# Patient Record
Sex: Male | Born: 1960
Health system: Southern US, Community
[De-identification: ages and names within clinical notes are randomized; demographics above are authoritative.]

## PROBLEM LIST (undated history)

## (undated) DIAGNOSIS — D649 Anemia, unspecified: Secondary | ICD-10-CM

## (undated) DIAGNOSIS — G43909 Migraine, unspecified, not intractable, without status migrainosus: Secondary | ICD-10-CM

## (undated) DIAGNOSIS — F419 Anxiety disorder, unspecified: Secondary | ICD-10-CM

## (undated) DIAGNOSIS — R9430 Abnormal result of cardiovascular function study, unspecified: Secondary | ICD-10-CM

## (undated) DIAGNOSIS — I351 Nonrheumatic aortic (valve) insufficiency: Secondary | ICD-10-CM

## (undated) DIAGNOSIS — Z79899 Other long term (current) drug therapy: Secondary | ICD-10-CM

## (undated) DIAGNOSIS — IMO0002 Reserved for concepts with insufficient information to code with codable children: Secondary | ICD-10-CM

## (undated) DIAGNOSIS — Z952 Presence of prosthetic heart valve: Secondary | ICD-10-CM

## (undated) DIAGNOSIS — I341 Nonrheumatic mitral (valve) prolapse: Secondary | ICD-10-CM

## (undated) DIAGNOSIS — K922 Gastrointestinal hemorrhage, unspecified: Secondary | ICD-10-CM

## (undated) DIAGNOSIS — I77810 Thoracic aortic ectasia: Secondary | ICD-10-CM

## (undated) HISTORY — DX: Presence of prosthetic heart valve: Z95.2

## (undated) HISTORY — DX: Gastrointestinal hemorrhage, unspecified: K92.2

## (undated) HISTORY — DX: Nonrheumatic mitral (valve) prolapse: I34.1

## (undated) HISTORY — DX: Migraine, unspecified, not intractable, without status migrainosus: G43.909

## (undated) HISTORY — DX: Abnormal result of cardiovascular function study, unspecified: R94.30

## (undated) HISTORY — DX: Other long term (current) drug therapy: Z79.899

## (undated) HISTORY — DX: Anemia, unspecified: D64.9

## (undated) HISTORY — DX: Reserved for concepts with insufficient information to code with codable children: IMO0002

## (undated) HISTORY — DX: Nonrheumatic aortic (valve) insufficiency: I35.1

## (undated) HISTORY — DX: Anxiety disorder, unspecified: F41.9

## (undated) HISTORY — DX: Thoracic aortic ectasia: I77.810

---

## 2000-08-25 ENCOUNTER — Emergency Department (HOSPITAL_COMMUNITY): Admission: EM | Admit: 2000-08-25 | Discharge: 2000-08-25 | Payer: Self-pay

## 2000-10-11 ENCOUNTER — Encounter: Payer: Self-pay | Admitting: Emergency Medicine

## 2000-10-11 ENCOUNTER — Inpatient Hospital Stay (HOSPITAL_COMMUNITY): Admission: EM | Admit: 2000-10-11 | Discharge: 2000-10-12 | Payer: Self-pay | Admitting: Emergency Medicine

## 2000-10-24 ENCOUNTER — Ambulatory Visit (HOSPITAL_COMMUNITY): Admission: RE | Admit: 2000-10-24 | Discharge: 2000-10-24 | Payer: Self-pay | Admitting: Cardiology

## 2002-02-13 ENCOUNTER — Ambulatory Visit (HOSPITAL_COMMUNITY): Admission: RE | Admit: 2002-02-13 | Discharge: 2002-02-13 | Payer: Self-pay | Admitting: Cardiology

## 2003-08-26 ENCOUNTER — Ambulatory Visit (HOSPITAL_COMMUNITY): Admission: RE | Admit: 2003-08-26 | Discharge: 2003-08-26 | Payer: Self-pay | Admitting: Cardiology

## 2004-11-08 ENCOUNTER — Ambulatory Visit: Payer: Self-pay | Admitting: Cardiology

## 2004-11-08 ENCOUNTER — Ambulatory Visit (HOSPITAL_COMMUNITY): Admission: RE | Admit: 2004-11-08 | Discharge: 2004-11-08 | Payer: Self-pay | Admitting: Cardiology

## 2004-11-09 ENCOUNTER — Ambulatory Visit (HOSPITAL_COMMUNITY): Admission: RE | Admit: 2004-11-09 | Discharge: 2004-11-09 | Payer: Self-pay | Admitting: Cardiology

## 2004-11-14 ENCOUNTER — Inpatient Hospital Stay (HOSPITAL_BASED_OUTPATIENT_CLINIC_OR_DEPARTMENT_OTHER): Admission: RE | Admit: 2004-11-14 | Discharge: 2004-11-14 | Payer: Self-pay | Admitting: Cardiology

## 2004-11-14 ENCOUNTER — Ambulatory Visit: Payer: Self-pay | Admitting: Cardiology

## 2004-11-17 ENCOUNTER — Ambulatory Visit (HOSPITAL_COMMUNITY): Admission: RE | Admit: 2004-11-17 | Discharge: 2004-11-17 | Payer: Self-pay | Admitting: Cardiology

## 2004-11-25 ENCOUNTER — Ambulatory Visit: Payer: Self-pay | Admitting: Cardiology

## 2004-12-05 ENCOUNTER — Inpatient Hospital Stay (HOSPITAL_COMMUNITY): Admission: RE | Admit: 2004-12-05 | Discharge: 2004-12-10 | Payer: Self-pay | Admitting: Cardiothoracic Surgery

## 2004-12-05 HISTORY — PX: AORTIC VALVE REPLACEMENT: SHX41

## 2004-12-12 ENCOUNTER — Ambulatory Visit: Payer: Self-pay | Admitting: Cardiology

## 2004-12-13 ENCOUNTER — Emergency Department (HOSPITAL_COMMUNITY): Admission: EM | Admit: 2004-12-13 | Discharge: 2004-12-13 | Payer: Self-pay | Admitting: Emergency Medicine

## 2004-12-19 ENCOUNTER — Ambulatory Visit: Payer: Self-pay | Admitting: Cardiology

## 2004-12-23 ENCOUNTER — Ambulatory Visit: Payer: Self-pay | Admitting: Cardiology

## 2004-12-23 ENCOUNTER — Ambulatory Visit: Payer: Self-pay | Admitting: Internal Medicine

## 2005-01-02 ENCOUNTER — Ambulatory Visit: Payer: Self-pay | Admitting: *Deleted

## 2005-01-17 ENCOUNTER — Ambulatory Visit: Payer: Self-pay | Admitting: Internal Medicine

## 2005-02-06 ENCOUNTER — Ambulatory Visit: Payer: Self-pay | Admitting: Cardiology

## 2005-02-27 ENCOUNTER — Ambulatory Visit: Payer: Self-pay | Admitting: Cardiology

## 2005-03-27 ENCOUNTER — Ambulatory Visit: Payer: Self-pay | Admitting: Cardiology

## 2005-04-17 ENCOUNTER — Ambulatory Visit: Payer: Self-pay | Admitting: Cardiology

## 2005-05-16 ENCOUNTER — Ambulatory Visit: Payer: Self-pay | Admitting: Cardiovascular Disease

## 2005-06-15 ENCOUNTER — Ambulatory Visit: Payer: Self-pay | Admitting: Cardiology

## 2005-07-06 ENCOUNTER — Ambulatory Visit: Payer: Self-pay | Admitting: Cardiology

## 2005-08-07 ENCOUNTER — Ambulatory Visit: Payer: Self-pay | Admitting: Cardiology

## 2005-09-04 ENCOUNTER — Ambulatory Visit: Payer: Self-pay | Admitting: Cardiology

## 2005-10-02 ENCOUNTER — Ambulatory Visit: Payer: Self-pay | Admitting: Cardiology

## 2005-10-30 ENCOUNTER — Ambulatory Visit: Payer: Self-pay | Admitting: Internal Medicine

## 2005-11-27 ENCOUNTER — Ambulatory Visit: Payer: Self-pay | Admitting: Cardiology

## 2005-12-25 ENCOUNTER — Ambulatory Visit: Payer: Self-pay | Admitting: Cardiology

## 2006-04-02 ENCOUNTER — Ambulatory Visit (HOSPITAL_COMMUNITY): Admission: RE | Admit: 2006-04-02 | Discharge: 2006-04-02 | Payer: Self-pay | Admitting: Cardiology

## 2006-04-02 ENCOUNTER — Ambulatory Visit: Payer: Self-pay | Admitting: Cardiology

## 2006-11-26 ENCOUNTER — Ambulatory Visit: Payer: Self-pay | Admitting: Internal Medicine

## 2006-11-26 ENCOUNTER — Inpatient Hospital Stay (HOSPITAL_COMMUNITY): Admission: EM | Admit: 2006-11-26 | Discharge: 2006-11-28 | Payer: Self-pay | Admitting: Emergency Medicine

## 2006-12-03 ENCOUNTER — Ambulatory Visit: Payer: Self-pay | Admitting: Gastroenterology

## 2006-12-03 ENCOUNTER — Ambulatory Visit (HOSPITAL_COMMUNITY): Admission: RE | Admit: 2006-12-03 | Discharge: 2006-12-03 | Payer: Self-pay | Admitting: Gastroenterology

## 2007-01-03 ENCOUNTER — Ambulatory Visit: Payer: Self-pay | Admitting: Internal Medicine

## 2010-11-30 ENCOUNTER — Encounter: Payer: Self-pay | Admitting: Cardiology

## 2010-12-13 ENCOUNTER — Encounter: Payer: Self-pay | Admitting: Cardiology

## 2010-12-13 DIAGNOSIS — Z79899 Other long term (current) drug therapy: Secondary | ICD-10-CM | POA: Insufficient documentation

## 2010-12-13 DIAGNOSIS — Z952 Presence of prosthetic heart valve: Secondary | ICD-10-CM | POA: Insufficient documentation

## 2010-12-13 DIAGNOSIS — I351 Nonrheumatic aortic (valve) insufficiency: Secondary | ICD-10-CM | POA: Insufficient documentation

## 2010-12-13 DIAGNOSIS — K922 Gastrointestinal hemorrhage, unspecified: Secondary | ICD-10-CM | POA: Insufficient documentation

## 2010-12-14 ENCOUNTER — Ambulatory Visit (INDEPENDENT_AMBULATORY_CARE_PROVIDER_SITE_OTHER): Payer: 59 | Admitting: Cardiology

## 2010-12-14 ENCOUNTER — Encounter: Payer: Self-pay | Admitting: Cardiology

## 2010-12-14 DIAGNOSIS — Z5189 Encounter for other specified aftercare: Secondary | ICD-10-CM

## 2010-12-14 DIAGNOSIS — I359 Nonrheumatic aortic valve disorder, unspecified: Secondary | ICD-10-CM

## 2010-12-14 DIAGNOSIS — Z952 Presence of prosthetic heart valve: Secondary | ICD-10-CM

## 2010-12-14 DIAGNOSIS — Z79899 Other long term (current) drug therapy: Secondary | ICD-10-CM

## 2010-12-14 DIAGNOSIS — K922 Gastrointestinal hemorrhage, unspecified: Secondary | ICD-10-CM

## 2010-12-14 NOTE — Patient Instructions (Signed)
Your physician has requested that you have an echocardiogram. Echocardiography is a painless test that uses sound waves to create images of your heart. It provides your doctor with information about the size and shape of your heart and how well your heart's chambers and valves are working. This procedure takes approximately one hour. There are no restrictions for this procedure.     Your physician wants you to follow-up in: 2 years. You will receive a reminder letter in the mail two months in advance. If you don't receive a letter, please call our office to schedule the follow-up appointment.  

## 2010-12-14 NOTE — Assessment & Plan Note (Signed)
The patient knows to continue on Coumadin indefinitely.  He is doing very well.  As part of today's evaluation I have pulled out all of the old records from 2006 in 2007.  He they have been reviewed and this medical record has been updated to list his problems.  As mentioned he has not been seen since 2007.  Two-dimensional echo will be ordered.  I will plan to see him back in one to 2 years in followup assuming there is no significant abnormality with the followup echo

## 2010-12-14 NOTE — Assessment & Plan Note (Signed)
Patient is doing very well after aortic valve replacement in 2006.  We have not seen him in followup because he has done so well.  It is good that is here now so that we can proceed with a followup 2-D echo.  His physical exam suggest excellent valvular function.  He is on Coumadin.

## 2010-12-14 NOTE — Assessment & Plan Note (Signed)
The LAD the patient's GI bleed in 2007 was never known.  Fortunately it stopped and he said no recurrent problems.

## 2010-12-14 NOTE — Progress Notes (Signed)
HPI The patient is seen for cardiology followup.  I saw him last in 2007.  He had severe aortic insufficiency.  He had an aortic valve replacement in 2006 with a St. Jude mechanical valve.  He did very well.  He has had only one echo since then in 2007.  He had a GI bleed in 2007.  Endoscopy and colonoscopy were normal and the source was never found.  He has not had any recurrent bleeding.  He is fully active.  His Coumadin is followed carefully and he has no problems. Not on File  Current Outpatient Prescriptions  Medication Sig Dispense Refill  . warfarin (COUMADIN) 5 MG tablet Take 5 mg by mouth as directed.          History   Social History  . Marital Status: Married    Spouse Name: N/A    Number of Children: N/A  . Years of Education: N/A   Occupational History  . 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 Smoking status: Never Smoker   . Smokeless tobacco: Not on file  . Alcohol Use: No  . Drug Use: No  . Sexually Active: Not on file   Other Topics Concern  . Not on file   Social History Narrative  . No narrative on file    Family History  Problem Relation Age of Onset  . Ulcers Mother   . Hypertension Father   . Prostate cancer Father   . Hypertension Brother     Past Medical History  Diagnosis Date  . Anxiety   . Anemia   . GI bleed     March, 2008, normal endoscopy and colonoscopy, question of small bowel ulcers by capsule endoscopy  . Aortic insufficiency     EF 55%    , AVR 2006  . S/P AVR (aortic valve replacement)      Dr. Tyrone Sage /      March, 2006, 27 mm St. Jude mechanical valve  . Drug therapy     Coumadin for mechanical aortic valve prosthesis    Past Surgical History  Procedure Date  . Aortic valve replacement 12/05/04    ROS  Patient denies fever, chills, headache, sweats, rash, change in vision, change in hearing, chest pain, cough, nausea vomiting, urinary symptoms.  All other systems are reviewed and are  negative. PHYSICAL EXAM Patient looks quite good.  He is oriented to person time and place.  Affect is normal.  Head is atraumatic.  There is no xanthelasma.  Urine carotid bruits.  There is no jugular venous distention.  Lungs are clear.  Respiratory effort is not labored.  Cardiac exam reveals S1.  There is a crisp S2 from closure of the mechanical prosthesis.  No AI is heard.  Abdomen is soft.  There is no peripheral edema.  There are no musculoskeletal deformities.  There no skin rashes. Filed Vitals:   12/14/10 1449  BP: 102/76  Pulse: 75  Height: 6' (1.829 m)  Weight: 178 lb (80.74 kg)    EKG The patient had an EKG done on December 14, 2010.  I have reviewed this carefully.  There is normal sinus rhythm with increased voltage. ASSESSMENT & PLAN

## 2010-12-27 DIAGNOSIS — I359 Nonrheumatic aortic valve disorder, unspecified: Secondary | ICD-10-CM

## 2012-12-19 ENCOUNTER — Ambulatory Visit: Payer: 59 | Admitting: Cardiology

## 2013-02-10 ENCOUNTER — Other Ambulatory Visit: Payer: Self-pay

## 2013-06-09 ENCOUNTER — Encounter: Payer: Self-pay | Admitting: Cardiology

## 2013-09-10 ENCOUNTER — Encounter: Payer: Self-pay | Admitting: Cardiology

## 2013-09-10 DIAGNOSIS — G43909 Migraine, unspecified, not intractable, without status migrainosus: Secondary | ICD-10-CM | POA: Insufficient documentation

## 2013-09-10 DIAGNOSIS — I341 Nonrheumatic mitral (valve) prolapse: Secondary | ICD-10-CM | POA: Insufficient documentation

## 2013-09-10 DIAGNOSIS — Q231 Congenital insufficiency of aortic valve: Secondary | ICD-10-CM | POA: Insufficient documentation

## 2013-09-10 DIAGNOSIS — R9430 Abnormal result of cardiovascular function study, unspecified: Secondary | ICD-10-CM | POA: Insufficient documentation

## 2013-09-10 DIAGNOSIS — I77810 Thoracic aortic ectasia: Secondary | ICD-10-CM | POA: Insufficient documentation

## 2014-08-12 ENCOUNTER — Emergency Department (INDEPENDENT_AMBULATORY_CARE_PROVIDER_SITE_OTHER): Payer: 59

## 2014-08-12 ENCOUNTER — Encounter (HOSPITAL_COMMUNITY): Payer: Self-pay | Admitting: Emergency Medicine

## 2014-08-12 ENCOUNTER — Emergency Department (HOSPITAL_COMMUNITY)
Admission: EM | Admit: 2014-08-12 | Discharge: 2014-08-12 | Disposition: A | Discharge status: Home / Self Care | Payer: 59 | Source: Home / Self Care | Attending: Family Medicine | Admitting: Family Medicine

## 2014-08-12 DIAGNOSIS — S86812A Strain of other muscle(s) and tendon(s) at lower leg level, left leg, initial encounter: Secondary | ICD-10-CM

## 2014-08-12 DIAGNOSIS — S8990XA Unspecified injury of unspecified lower leg, initial encounter: Secondary | ICD-10-CM

## 2014-08-12 DIAGNOSIS — S86912A Strain of unspecified muscle(s) and tendon(s) at lower leg level, left leg, initial encounter: Secondary | ICD-10-CM

## 2014-08-12 NOTE — ED Notes (Signed)
C/o left knee pain onset Sunday; reports he was paragliding and when he landed, twisted left knee Sx include: swelling and tender Steady gait; NAD Has been applying ice w/no relief Alert, no signs of acute distress.

## 2014-08-12 NOTE — ED Provider Notes (Signed)
CSN: [id number]     Arrival date & time 08/12/14  1535 History   First MD Initiated Contact with Patient 08/12/14 1706     Chief Complaint  Patient presents with  . Knee Injury   (Consider location/radiation/quality/duration/timing/severity/associated sxs/prior Treatment) Patient is a 53 y.o. male presenting with knee pain. The history is provided by the patient.  Knee Pain Location:  Knee Injury: yes   Mechanism of injury: fall   Mechanism of injury comment:  Fell backward running pulling a parachute and twisting knee on sun, off and on swelling and, pain. Fall:    Fall occurred:  Tripped   Impact surface:  Dirt Knee location:  L knee Pain details:    Severity:  Mild   Onset quality:  Gradual Chronicity:  New Dislocation: no   Worsened by:  Nothing tried Ineffective treatments:  None tried Risk factors comment:  Concern for being on coumadin.   Past Medical History  Diagnosis Date  . Anxiety   . Anemia   . GI bleed     March, 2008, normal endoscopy and colonoscopy, question of small bowel ulcers by capsule endoscopy  . Aortic insufficiency     EF 55%    , AVR 2006  . S/P AVR (aortic valve replacement)      Dr. Servando Snare /      March, 2006, 27 mm St. Jude mechanical valve  . Drug therapy     Coumadin for mechanical aortic valve prosthesis  . Ejection fraction   . Dilated aortic root   . Migraine   . Mitral valve prolapse    Past Surgical History  Procedure Laterality Date  . Aortic valve replacement  12/05/04   Family History  Problem Relation Age of Onset  . Ulcers Mother   . Hypertension Father   . Prostate cancer Father   . Hypertension Brother    History  Substance Use Topics  . Smoking status: Never Smoker   . Smokeless tobacco: Not on file  . Alcohol Use: No    Review of Systems  Constitutional: Negative.   Musculoskeletal: Positive for joint swelling. Negative for gait problem.  Skin: Negative.     Allergies  Review of patient's allergies  indicates no known allergies.  Home Medications   Prior to Admission medications   Medication Sig Start Date End Date Taking? Authorizing Provider  warfarin (COUMADIN) 5 MG tablet Take 5 mg by mouth as directed.     Yes Historical Provider, MD   BP 120/76 mmHg  Pulse 69  Temp(Src) 97.9 F (36.6 C) (Oral)  Resp 18  SpO2 100% Physical Exam  Constitutional: He is oriented to person, place, and time. He appears well-developed and well-nourished.  Abdominal: Soft. Bowel sounds are normal.  Musculoskeletal: Normal range of motion. He exhibits no tenderness.       Left knee: He exhibits normal range of motion, no swelling, no effusion, no deformity, no LCL laxity, normal patellar mobility, normal meniscus and no MCL laxity. No medial joint line and no lateral joint line tenderness noted.  Neurological: He is alert and oriented to person, place, and time.  Skin: Skin is warm and dry.  Nursing note and vitals reviewed.   ED Course  Procedures (including critical care time) Labs Review Labs Reviewed - No data to display  Imaging Review Dg Knee Complete 4 Views Left  08/12/2014   CLINICAL DATA:  Per pt: fell Sunday, thought nothing was wrong. On Monday evening left knee started hurting  with swelling and pain has increased. Patient pointed to the poster and medial left knee, stated that he can't fully bend knee without pain.  EXAM: LEFT KNEE - COMPLETE 4+ VIEW  COMPARISON:  None.  FINDINGS: No fracture of the proximal tibia or distal femur. Patella is normal. Small to moderate suprapatellar joint effusion.  IMPRESSION: 1. No fracture dislocation. 2. Suprapatellar joint effusion.   Electronically Signed   By: Suzy Bouchard M.D.   On: 08/12/2014 17:56   X-rays reviewed and report per radiologist.   MDM   1. Knee strain, left, initial encounter   2. Knee injury        Billy Fischer, MD 08/12/14 [phone]

## 2014-08-12 NOTE — Discharge Instructions (Signed)
Knee brace for comfort, ice , elevate and tylenol as needed, see orthopedist next week if further problems.

## 2015-06-11 ENCOUNTER — Encounter: Payer: Self-pay | Admitting: Cardiology

## 2015-06-11 DIAGNOSIS — Z952 Presence of prosthetic heart valve: Secondary | ICD-10-CM

## 2015-06-11 DIAGNOSIS — R9430 Abnormal result of cardiovascular function study, unspecified: Secondary | ICD-10-CM

## 2015-06-11 NOTE — Progress Notes (Signed)
I have followed the patient over the years for his aortic valve disease. He works in EKG at the hospital. He is doing well. His office visits have been sporadic over time. His most recent echo was done in the spring of 2016. There is good function of his ventricle and his aortic prosthesis. The actual report of his echo is not listed in EPIC.  His care will be turned over to Dr. Lonna Cobb, at the request of the patient.  This note represents a summary of his problems:

## 2015-06-11 NOTE — Assessment & Plan Note (Signed)
The patient had a bicuspid aortic valve. CT scan showed no evidence of a coarct. He did not require aortic root replacement at the time of his surgery. The surgery was done in 2006. He has a 27 mm St. Jude mechanical valve. Coronary arteries were normal at that time. His valve has been assessed by echo over time. Most recent assessment was the spring of 2016. The echo report is not in EPIC. I reviewed it personally. The valve works well.

## 2015-06-11 NOTE — Assessment & Plan Note (Signed)
History of his ejection fraction has remained normal.

## 2015-06-11 NOTE — Assessment & Plan Note (Signed)
The patient had a GI bleed in 2008 while on Coumadin after his mechanical aortic prosthesis was placed in 2006. Endoscopy and colonoscopy were normal. There was question of small bowel ulcers by capsule endoscopy. This resolved. He has been treated with Coumadin successfully over the years.

## 2015-10-08 DIAGNOSIS — Z7901 Long term (current) use of anticoagulants: Secondary | ICD-10-CM | POA: Diagnosis not present

## 2015-12-02 MED FILL — WARFARIN SODIUM 4 MG TABLET: 4 | 90 days supply | Qty: 100 | Fill #3

## 2015-12-09 ENCOUNTER — Telehealth: Payer: Self-pay | Admitting: Cardiology

## 2015-12-09 NOTE — Telephone Encounter (Signed)
New message  Pt called states that he was advised per Dr. Angelena Form to get worked in as a new patient/ prev Ron Parker pt; Please call back to discuss.

## 2015-12-09 NOTE — Telephone Encounter (Signed)
Left a message for the pt to call back.  

## 2015-12-14 NOTE — Telephone Encounter (Signed)
Daniel Hickman, Pt needs appointment with Dr Angelena Form.

## 2015-12-14 NOTE — Telephone Encounter (Signed)
Daniel Hickman is returning a call , Please call at work [phone] he will be there until 3 .Marland Kitchen [phone].. Thanks

## 2015-12-30 NOTE — Telephone Encounter (Signed)
Pt scheduled to see Dr Angelena Form on 01/03/16.

## 2016-01-03 ENCOUNTER — Ambulatory Visit (INDEPENDENT_AMBULATORY_CARE_PROVIDER_SITE_OTHER): Payer: 59 | Admitting: Cardiovascular Disease

## 2016-01-03 ENCOUNTER — Other Ambulatory Visit: Payer: Self-pay

## 2016-01-03 ENCOUNTER — Encounter: Payer: Self-pay | Admitting: Cardiovascular Disease

## 2016-01-03 VITALS — BP 98/70 | HR 74 | Ht 72.0 in | Wt 189.0 lb

## 2016-01-03 DIAGNOSIS — Z954 Presence of other heart-valve replacement: Secondary | ICD-10-CM | POA: Diagnosis not present

## 2016-01-03 DIAGNOSIS — Z952 Presence of prosthetic heart valve: Secondary | ICD-10-CM

## 2016-01-03 DIAGNOSIS — I359 Nonrheumatic aortic valve disorder, unspecified: Secondary | ICD-10-CM | POA: Diagnosis not present

## 2016-01-03 NOTE — Patient Instructions (Signed)

## 2016-01-03 NOTE — Progress Notes (Signed)
Chief Complaint  Patient presents with  . New Patient (Initial Visit)    AVR   History of Present Illness: 55 yo male with history of aortic valve disease s/p AVR with St. Jude mechanical valve in 2006 on chronic coumadin therapy here today as a new patient to establish in my office. He has been followed by Dr. Ron Parker who has retired. GI bleeding in 2007 with no source found and no further bleeding since then. He has not been seen in this office since 2012. His coumadin is followed by Dr. Terrence Dupont. He tells me that he does not discuss cardiac issues with Dr. Terrence Dupont but he manages his INR and writes for his coumadin.  Echo April 2012 with normally functioning valve, normal LV systolic function.   He tells me that he has been doing well. He has no chest pain or SOB. No dizziness, near syncope or syncope. He has been taking coumadin every day as prescribed.   Primary Care Physician: Chesley Noon, MD  Past Medical History  Diagnosis Date  . Anxiety   . Anemia   . GI bleed     March, 2008, normal endoscopy and colonoscopy, question of small bowel ulcers by capsule endoscopy  . Aortic insufficiency     EF 55%    , AVR 2006  . S/P AVR (aortic valve replacement)      Dr. Servando Snare /      March, 2006, 27 mm St. Jude mechanical valve  . Drug therapy     Coumadin for mechanical aortic valve prosthesis  . Ejection fraction   . Dilated aortic root ([place])   . Migraine   . Mitral valve prolapse     Past Surgical History  Procedure Laterality Date  . Aortic valve replacement  12/05/04    Current Outpatient Prescriptions  Medication Sig Dispense Refill  . warfarin (COUMADIN) 4 MG tablet Take as directed by coumadiin clinic  3   No current facility-administered medications for this visit.    No Known Allergies  Social History   Social History  . Marital Status: Married    Spouse Name: N/A  . Number of Children: N/A  . Years of Education: N/A   Occupational History  . 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 Smoking status: Never Smoker   . Smokeless tobacco: Not on file  . Alcohol Use: No  . Drug Use: No  . Sexual Activity: Not on file   Other Topics Concern  . Not on file   Social History Narrative    Family History  Problem Relation Age of Onset  . Ulcers Mother   . Hypertension Father   . Prostate cancer Father   . Hypertension Brother   . Heart attack Maternal Grandfather     Review of Systems:  As stated in the HPI and otherwise negative.   BP 98/70 mmHg  Pulse 74  Ht 6' (1.829 m)  Wt 189 lb (85.73 kg)  BMI 25.63 kg/m2  SpO2 96%  Physical Examination: General: Well developed, well nourished, NAD HEENT: OP clear, mucus membranes moist SKIN: warm, dry. No rashes. Neuro: No focal deficits Musculoskeletal: Muscle strength 5/5 all ext Psychiatric: Mood and affect normal Neck: No JVD, no carotid bruits, no thyromegaly, no lymphadenopathy. Lungs:Clear bilaterally, no wheezes, rhonci, crackles Cardiovascular: Regular rate and rhythm. No murmurs, gallops or rubs. Abdomen:Soft. Bowel sounds present. Non-tender.  Extremities: No lower extremity edema. Pulses are 2 + in the  bilateral DP/PT.  EKG:  EKG is reviewed today. This EKG was brought in by the patient. I have scanned it in.  The ekg ordered today demonstrates NSR, T wave flattening lateral leads.   Recent Labs: No results found for requested labs within last 365 days.   Lipid Panel No results found for: CHOL, TRIG, HDL, CHOLHDL, VLDL, LDLCALC, LDLDIRECT   Wt Readings from Last 3 Encounters:  01/03/16 189 lb (85.73 kg)  12/14/10 178 lb (80.74 kg)    Other studies Reviewed: Additional studies/ records that were reviewed today include: . Review of the above records demonstrates:   Assessment and Plan:   1. Aortic valve disease s/p AVR with mechanical St Jude valve: He has done well. He is on coumadin daily and this is followed by Dr. Terrence Dupont. He tells me that Dr.  Terrence Dupont checks his INR monthly and writes for his coumadin. He uses antibiotic prophylaxis before dental visits. His last official echo was in 2012. Will repeat in 2018.   Current medicines are reviewed at length with the patient today.  The patient does not have concerns regarding medicines.  The following changes have been made:  no change  Labs/ tests ordered today include:  No orders of the defined types were placed in this encounter.    Disposition:   FU with me in 12  months  Signed, Lauree Chandler, MD 01/03/2016 4:57 PM    [facility] [address], [place], [place]  [address] Phone: [phone]; Fax: [phone]

## 2016-01-29 ENCOUNTER — Other Ambulatory Visit: Payer: Self-pay | Admitting: Adult Health

## 2016-01-29 MED ORDER — ACYCLOVIR 5 % EX OINT: 1.0000 "application " | TOPICAL_OINTMENT | CUTANEOUS | Status: DC

## 2016-01-31 MED FILL — ACYCLOVIR 5% OINTMENT: 5 | 30 days supply | Qty: 15 | Fill #0

## 2016-02-08 DIAGNOSIS — Z952 Presence of prosthetic heart valve: Secondary | ICD-10-CM | POA: Diagnosis not present

## 2016-03-02 ENCOUNTER — Ambulatory Visit (HOSPITAL_COMMUNITY)
Admission: EM | Admit: 2016-03-02 | Discharge: 2016-03-02 | Disposition: A | Discharge status: Home / Self Care | Payer: 59 | Attending: Emergency Medicine | Admitting: Emergency Medicine

## 2016-03-02 ENCOUNTER — Encounter (HOSPITAL_COMMUNITY): Payer: Self-pay | Admitting: Emergency Medicine

## 2016-03-02 DIAGNOSIS — T148 Other injury of unspecified body region: Secondary | ICD-10-CM

## 2016-03-02 DIAGNOSIS — W57XXXA Bitten or stung by nonvenomous insect and other nonvenomous arthropods, initial encounter: Secondary | ICD-10-CM

## 2016-03-02 NOTE — Discharge Instructions (Signed)
We removed the tick today. If you develop fevers, rash, or spreading redness at the tick bite site, please come back or see your PCP. The itching could be a reaction to the tick bite, but the skin does appear irritated. I would try putting some sort of ointment on the affected areas. This could be Vaseline, Neosporin, or Lamisil. Follow-up if not improving.

## 2016-03-02 NOTE — ED Notes (Signed)
PT has had itching at his belt line for two days. PT just noticed a small tick there today. PT reports tick is so small he is afraid he cannot remove it without the head coming off.

## 2016-03-02 NOTE — ED Provider Notes (Signed)
CSN: [id number]     Arrival date & time 03/02/16  1614 History   First MD Initiated Contact with Patient 03/02/16 1653     Chief Complaint  Patient presents with  . Tick Removal   (Consider location/radiation/quality/duration/timing/severity/associated sxs/prior Treatment) HPI  He is a 55 year old man here for tick removal. He reports seeing a tick for the first time today. It is located just above the pubic bone. He states he was unable to see it or get a good grip on it to remove it himself. He is not sure when it attached, but he reports some itching of the penis and scrotum for the last 2 days. He denies any specific rash or lesion. He has not applied any ointments or creams. No rash at the site of the tick bite.  Past Medical History  Diagnosis Date  . Anxiety   . Anemia   . GI bleed     March, 2008, normal endoscopy and colonoscopy, question of small bowel ulcers by capsule endoscopy  . Aortic insufficiency     EF 55%    , AVR 2006  . S/P AVR (aortic valve replacement)      Dr. Servando Snare /      March, 2006, 27 mm St. Jude mechanical valve  . Drug therapy     Coumadin for mechanical aortic valve prosthesis  . Ejection fraction   . Dilated aortic root ([place])   . Migraine   . Mitral valve prolapse    Past Surgical History  Procedure Laterality Date  . Aortic valve replacement  12/05/04   Family History  Problem Relation Age of Onset  . Ulcers Mother   . Hypertension Father   . Prostate cancer Father   . Hypertension Brother   . Heart attack Maternal Grandfather    Social History  Substance Use Topics  . Smoking status: Never Smoker   . Smokeless tobacco: None  . Alcohol Use: No    Review of Systems As in history of present illness Allergies  Review of patient's allergies indicates no known allergies.  Home Medications   Prior to Admission medications   Medication Sig Start Date End Date Taking? Authorizing Provider  warfarin (COUMADIN) 4 MG tablet Take as  directed by coumadiin clinic 12/02/15  Yes Historical Provider, MD  acyclovir ointment (ZOVIRAX) 5 % Apply 1 application topically every 3 (three) hours. 01/29/16   Lendon Colonel, NP   Meds Ordered and Administered this Visit  Medications - No data to display  BP 105/80 mmHg  Pulse 74  Temp(Src) 98.1 F (36.7 C) (Oral)  Resp 12  SpO2 98% No data found.   Physical Exam  Constitutional: He is oriented to person, place, and time. He appears well-developed and well-nourished. No distress.  Cardiovascular: Normal rate.   Pulmonary/Chest: Effort normal.  Genitourinary:     He has a little bit of skin irritation of the dorsal penis.  Neurological: He is alert and oriented to person, place, and time.    ED Course  .Foreign Body Removal Date/Time: 03/02/2016 6:06 PM Performed by: Melony Overly Authorized by: Melony Overly Consent: Verbal consent obtained. Risks and benefits: risks, benefits and alternatives were discussed Consent given by: patient Patient understanding: patient states understanding of the procedure being performed Patient identity confirmed: verbally with patient Time out: Immediately prior to procedure a "time out" was called to verify the correct patient, procedure, equipment, support staff and site/side marked as required. Body area: skin General location:  trunk Location details: groin 1 objects recovered. Objects recovered: tick Post-procedure assessment: foreign body removed Patient tolerance: Patient tolerated the procedure well with no immediate complications   (including critical care time)  Labs Review Labs Reviewed - No data to display  Imaging Review No results found.   MDM   1. Tick bite with subsequent removal of tick    Tick successfully removed. No indication for antibiotics at this time. Things to look out for reviewed. Recommended applying a lubricating ointment to the affected area of the penis. This could be Vaseline,  Neosporin, or Lamisil. Follow-up as needed.    Melony Overly, MD 03/02/16 [phone]

## 2016-03-20 MED FILL — WARFARIN SODIUM 4 MG TABLET: 4 | 90 days supply | Qty: 100 | Fill #0

## 2016-03-20 MED FILL — ACYCLOVIR 5% OINTMENT: 5 | 30 days supply | Qty: 15 | Fill #1

## 2016-05-02 DIAGNOSIS — L501 Idiopathic urticaria: Secondary | ICD-10-CM | POA: Diagnosis not present

## 2016-05-02 MED FILL — predniSONE 20 MG TABS: 20 | 5 days supply | Qty: 10 | Fill #0

## 2016-05-29 MED FILL — ACYCLOVIR 5% OINTMENT: 5 | 30 days supply | Qty: 15 | Fill #2

## 2016-05-29 MED FILL — WARFARIN SODIUM 4 MG TABLET: 4 | 90 days supply | Qty: 100 | Fill #1

## 2016-06-26 DIAGNOSIS — Z7901 Long term (current) use of anticoagulants: Secondary | ICD-10-CM | POA: Diagnosis not present

## 2016-08-07 DIAGNOSIS — L820 Inflamed seborrheic keratosis: Secondary | ICD-10-CM | POA: Diagnosis not present

## 2016-08-28 ENCOUNTER — Other Ambulatory Visit: Payer: Self-pay | Admitting: Plastic Surgery

## 2016-08-28 DIAGNOSIS — L820 Inflamed seborrheic keratosis: Secondary | ICD-10-CM | POA: Diagnosis not present

## 2016-08-29 MED FILL — WARFARIN SODIUM 4 MG TABLET: 4 | 90 days supply | Qty: 100 | Fill #2

## 2016-08-29 MED FILL — AMOXICILLIN 500 MG CAPSULE: 500 | 3 days supply | Qty: 12 | Fill #0

## 2016-10-09 MED FILL — NITROGLYCERIN 0.4 MG TAB SL: 0.4 | 15 days supply | Qty: 25 | Fill #0

## 2016-11-30 DIAGNOSIS — Z952 Presence of prosthetic heart valve: Secondary | ICD-10-CM | POA: Diagnosis not present

## 2016-12-11 MED FILL — WARFARIN SODIUM 4 MG TABLET: 4 | 90 days supply | Qty: 100 | Fill #3

## 2016-12-25 DIAGNOSIS — H5203 Hypermetropia, bilateral: Secondary | ICD-10-CM | POA: Diagnosis not present

## 2016-12-25 DIAGNOSIS — H52221 Regular astigmatism, right eye: Secondary | ICD-10-CM | POA: Diagnosis not present

## 2017-01-12 ENCOUNTER — Encounter: Payer: Self-pay | Admitting: Physician Assistant

## 2017-01-15 ENCOUNTER — Encounter: Payer: Self-pay | Admitting: Cardiovascular Disease

## 2017-01-16 ENCOUNTER — Encounter (INDEPENDENT_AMBULATORY_CARE_PROVIDER_SITE_OTHER): Payer: Self-pay

## 2017-01-16 ENCOUNTER — Encounter: Payer: Self-pay | Admitting: Physician Assistant

## 2017-01-16 ENCOUNTER — Ambulatory Visit (INDEPENDENT_AMBULATORY_CARE_PROVIDER_SITE_OTHER): Payer: 59 | Admitting: Physician Assistant

## 2017-01-16 VITALS — BP 100/76 | HR 77 | Ht 72.0 in | Wt 185.4 lb

## 2017-01-16 DIAGNOSIS — Z952 Presence of prosthetic heart valve: Secondary | ICD-10-CM

## 2017-01-16 NOTE — Progress Notes (Signed)
Cardiology Office Note    Date:  01/16/2017   ID:  Daniel Hickman, DOB Jan 26, 1961, MRN [id number]  PCP:  Daniel Noon, MD  Cardiologist: Dr. Angelena Hickman  Chief Complaint  Patient presents with  . Follow-up    seen for Dr. Angelena Hickman    History of Present Illness:  Daniel Hickman is a 56 y.o. male with history of aortic valve disease s/p AVR with St. Jude mechanical valve in 2006 on chronic coumadin therapy. His coumadin is followed by Dr. Terrence Hickman. He tells me that he does not discuss cardiac issues with Dr. Terrence Hickman but he manages his INR and writes for his coumadin. Echo April 2012 with normally functioning valve, normal LV systolic function. He saw Dr. Darrold Hickman who recommended a repeat echo in 2018. Last echo in 2012 showed normal LV function with normal functioning AVR and trivial AI.  Patient is here for his yearly follow-up. He golfs often walks 18 holes without difficulty. He denies any chest pain, palpitations, dyspnea, dyspnea on exertion, dizziness or presyncope. He doesn't see his primary care regularly and doesn't think his cholesterol several been checked.      Past Medical History:  Diagnosis Date  . Anemia   . Anxiety   . Aortic insufficiency    EF 55%    , AVR 2006  . Dilated aortic root ([place])   . Drug therapy    Coumadin for mechanical aortic valve prosthesis  . Ejection fraction   . GI bleed    March, 2008, normal endoscopy and colonoscopy, question of small bowel ulcers by capsule endoscopy  . Migraine   . Mitral valve prolapse   . S/P AVR (aortic valve replacement)     Dr. Servando Hickman /      March, 2006, 27 mm St. Jude mechanical valve    Past Surgical History:  Procedure Laterality Date  . AORTIC VALVE REPLACEMENT  12/05/04    Current Medications: Outpatient Medications Prior to Visit  Medication Sig Dispense Refill  . warfarin (COUMADIN) 4 MG tablet Take as directed by coumadiin clinic  3  . acyclovir ointment (ZOVIRAX) 5 % Apply 1  application topically every 3 (three) hours. 15 g 3   No facility-administered medications prior to visit.      Allergies:   Patient has no known allergies.   Social History   Social History  . Marital status: Married    Spouse name: N/A  . Number of children: N/A  . Years of education: N/A   Occupational History  . EKG tech [employer]   Social History Main Topics  . Smoking status: Never Smoker  . Smokeless tobacco: Never Used  . Alcohol use No  . Drug use: No  . Sexual activity: Not on file   Other Topics Concern  . Not on file   Social History Narrative  . No narrative on file     Family History:  The patient's family history includes Heart attack in his maternal grandfather; Hypertension in his brother and father; Prostate cancer in his father; Ulcers in his mother.   ROS:   Please see the history of present illness.    Review of Systems  Constitution: Negative.  HENT: Negative.   Eyes: Negative.   Cardiovascular: Negative.   Respiratory: Negative.   Hematologic/Lymphatic: Negative.   Musculoskeletal: Negative.  Negative for joint pain.  Gastrointestinal: Negative.   Genitourinary: Negative.   Neurological: Negative.    All other systems reviewed and are negative.  PHYSICAL EXAM:   VS:  BP 100/76   Pulse 77   Ht 6' (1.829 m)   Wt 185 lb 6.4 oz (84.1 kg)   SpO2 96%   BMI 25.14 kg/m   Physical Exam  GEN: Well nourished, well developed, in no acute distress  Neck: no JVD, carotid bruits, or masses Cardiac:RRR; crisp valvular click, no murmurs, rubs, or gallops  Respiratory:  clear to auscultation bilaterally, normal work of breathing GI: soft, nontender, nondistended, + BS Ext: without cyanosis, clubbing, or edema, Good distal pulses bilaterally Neuro:  Alert and Oriented x 3 Psych: euthymic mood, full affect  Wt Readings from Last 3 Encounters:  01/16/17 185 lb 6.4 oz (84.1 kg)  01/03/16 189 lb (85.7 kg)  12/14/10 178 lb (80.7 kg)       Studies/Labs Reviewed:   EKG:  EKG is not ordered today.  The ekg  he brought with him from [facility] done on 01/15/17 shows normal sinus rhythm, normal EKG  Recent Labs: No results found for requested labs within last 8760 hours.   Lipid Panel No results found for: CHOL, TRIG, HDL, CHOLHDL, VLDL, LDLCALC, LDLDIRECT  Additional studies/ records that were reviewed today include:  2-D echo reviewed from 2012 normal LV function, normal functioning AVR with trivial AI    ASSESSMENT:    1. S/P AVR (aortic valve replacement)      PLAN:  In order of problems listed above:  Status post aVR in 2006 on Coumadin followed by DanielHarwani. He uses antibiotic prophylaxis before any dental visits. Last echo was in 2012.Dr. Angelena Hickman recommended an echo this year so will order. Patient prefers to have it done at [facility] since he works there. He's never had his cholesterol checked and does not have any baseline labs so we'll order those as well. Follow-up with DanielMcAlhany in 1 yr    Medication Adjustments/Labs and Tests Ordered: Current medicines are reviewed at length with the patient today.  Concerns regarding medicines are outlined above.  Medication changes, Labs and Tests ordered today are listed in the Patient Instructions below. Patient Instructions  Medication Instructions:     If you need a refill on your cardiac medications before your next appointment, please call your pharmacy.  Labwork:   Testing/Procedures: Your physician has requested that you have an echocardiogram. Echocardiography is a painless test that uses sound waves to create images of your heart. It provides your doctor with information about the size and shape of your heart and how well your heart's chambers and valves are working. This procedure takes approximately one hour. There are no restrictions for this procedure.    Follow-Up:    Any Other Special Instructions Will Be Listed Below (If Applicable).  Daniel Boast, PA-C  01/16/2017 2:46 PM    [facility] [address], [place], [place]  [id number] Phone: [phone]; Fax: [phone]

## 2017-01-16 NOTE — Patient Instructions (Addendum)
Medication Instructions:   Your physician recommends that you continue on your current medications as directed. Please refer to the Current Medication list given to you today.   If you need a refill on your cardiac medications before your next appointment, please call your pharmacy.  Labwork: CMET AND CBC    Testing/Procedures: Your physician has requested that you have an echocardiogram. Echocardiography is a painless test that uses sound waves to create images of your heart. It provides your doctor with information about the size and shape of your heart and how well your heart's chambers and valves are working. This procedure takes approximately one hour. There are no restrictions for this procedure.    Follow-Up:   Your physician wants you to follow-up in: [place] will receive a reminder letter in the mail two months in advance. If you don't receive a letter, please call our office to schedule the follow-up appointment.     Any Other Special Instructions Will Be Listed Below (If Applicable).

## 2017-01-17 LAB — COMPREHENSIVE METABOLIC PANEL
ALT: 15 IU/L (ref 0–44)
AST: 19 IU/L (ref 0–40)
Albumin/Globulin Ratio: 2.1 (ref 1.2–2.2)
Albumin: 4.4 g/dL (ref 3.5–5.5)
Alkaline Phosphatase: 73 IU/L (ref 39–117)
BUN/Creatinine Ratio: 16 (ref 9–20)
BUN: 14 mg/dL (ref 6–24)
Bilirubin Total: 0.7 mg/dL (ref 0.0–1.2)
CO2: 25 mmol/L (ref 18–29)
Calcium: 9.7 mg/dL (ref 8.7–10.2)
Chloride: 102 mmol/L (ref 96–106)
Creatinine, Ser: 0.88 mg/dL (ref 0.76–1.27)
GFR calc Af Amer: 112 mL/min/{1.73_m2} (ref >59)
GFR calc non Af Amer: 97 mL/min/{1.73_m2} (ref >59)
Globulin, Total: 2.1 g/dL (ref 1.5–4.5)
Glucose: 90 mg/dL (ref 65–99)
Potassium: 4.4 mmol/L (ref 3.5–5.2)
Sodium: 142 mmol/L (ref 134–144)
Total Protein: 6.5 g/dL (ref 6.0–8.5)

## 2017-01-17 LAB — CBC
Hematocrit: 43.9 % (ref 37.5–51.0)
Hemoglobin: 14.8 g/dL (ref 13.0–17.7)
MCH: 30.8 pg (ref 26.6–33.0)
MCHC: 33.7 g/dL (ref 31.5–35.7)
MCV: 92 fL (ref 79–97)
Platelets: 280 10*3/uL (ref 150–379)
RBC: 4.8 x10E6/uL (ref 4.14–5.80)
RDW: 13.2 % (ref 12.3–15.4)
WBC: 4.2 10*3/uL (ref 3.4–10.8)

## 2017-01-18 ENCOUNTER — Telehealth: Payer: Self-pay | Admitting: *Deleted

## 2017-01-18 DIAGNOSIS — Z79899 Other long term (current) drug therapy: Secondary | ICD-10-CM

## 2017-01-18 NOTE — Telephone Encounter (Signed)
-----   Message from Imogene Burn, PA-C sent at 01/17/2017  8:00 AM EDT ----- cmet and cbc all normal. Should have fasting lipids checked

## 2017-01-22 ENCOUNTER — Other Ambulatory Visit: Payer: 59 | Admitting: *Deleted

## 2017-01-22 DIAGNOSIS — Z79899 Other long term (current) drug therapy: Secondary | ICD-10-CM

## 2017-01-22 LAB — LIPID PANEL
Chol/HDL Ratio: 3.9 ratio (ref 0.0–5.0)
Cholesterol, Total: 180 mg/dL (ref 100–199)
HDL: 46 mg/dL (ref >39)
LDL Calculated: 116 mg/dL — ABNORMAL HIGH (ref 0–99)
Triglycerides: 92 mg/dL (ref 0–149)
VLDL Cholesterol Cal: 18 mg/dL (ref 5–40)

## 2017-01-26 ENCOUNTER — Telehealth: Payer: Self-pay | Admitting: Cardiovascular Disease

## 2017-01-26 DIAGNOSIS — E7849 Other hyperlipidemia: Secondary | ICD-10-CM

## 2017-01-26 NOTE — Telephone Encounter (Signed)
I spoke with pt and told him I could not fax these results to him.  I told him I could mail them to him. He would like me to do this.  Address confirmed and lipid profile results mailed to him.  I also told pt he could sign up for my chart to view results.

## 2017-01-26 NOTE — Telephone Encounter (Signed)
New Message  Pt voiced returning nurses call in regards to labs.

## 2017-01-26 NOTE — Telephone Encounter (Signed)
I spoke with pt and reviewed lab results with him.  He would like to call us back to schedule lab work in 6-12 months.

## 2017-01-26 NOTE — Telephone Encounter (Signed)
New message     Can you fax the numbers you gave him earlier ? He lost the paper with the numbers you gave him earlier    Fax [phone]

## 2017-01-29 ENCOUNTER — Other Ambulatory Visit (HOSPITAL_COMMUNITY): Payer: 59

## 2017-03-12 MED FILL — AMOXICILLIN 500 MG CAPSULE: 500 | 3 days supply | Qty: 12 | Fill #0

## 2017-03-13 MED FILL — WARFARIN SODIUM 4 MG TABLET: 4 | 90 days supply | Qty: 100 | Fill #0

## 2017-05-07 DIAGNOSIS — Z952 Presence of prosthetic heart valve: Secondary | ICD-10-CM | POA: Diagnosis not present

## 2017-06-11 DIAGNOSIS — S80861A Insect bite (nonvenomous), right lower leg, initial encounter: Secondary | ICD-10-CM | POA: Diagnosis not present

## 2017-06-11 DIAGNOSIS — L821 Other seborrheic keratosis: Secondary | ICD-10-CM | POA: Diagnosis not present

## 2017-06-11 DIAGNOSIS — D1801 Hemangioma of skin and subcutaneous tissue: Secondary | ICD-10-CM | POA: Diagnosis not present

## 2017-06-11 MED FILL — TRIAMCINOLONE 0.1% CREAM: 0.1 | 15 days supply | Qty: 30 | Fill #0

## 2017-06-12 MED FILL — WARFARIN SODIUM 4 MG TABLET: 4 | 90 days supply | Qty: 100 | Fill #1

## 2017-08-15 DIAGNOSIS — Z952 Presence of prosthetic heart valve: Secondary | ICD-10-CM | POA: Diagnosis not present

## 2017-09-12 MED FILL — WARFARIN SODIUM 4 MG TABLET: 4 | 90 days supply | Qty: 100 | Fill #2

## 2017-12-05 DIAGNOSIS — J101 Influenza due to other identified influenza virus with other respiratory manifestations: Secondary | ICD-10-CM | POA: Diagnosis not present

## 2017-12-26 MED FILL — WARFARIN SODIUM 4 MG TABLET: 4 | 90 days supply | Qty: 100 | Fill #3

## 2018-01-07 DIAGNOSIS — H52222 Regular astigmatism, left eye: Secondary | ICD-10-CM | POA: Diagnosis not present

## 2018-01-07 DIAGNOSIS — H04123 Dry eye syndrome of bilateral lacrimal glands: Secondary | ICD-10-CM | POA: Diagnosis not present

## 2018-01-07 DIAGNOSIS — H5203 Hypermetropia, bilateral: Secondary | ICD-10-CM | POA: Diagnosis not present

## 2018-01-17 DIAGNOSIS — Z952 Presence of prosthetic heart valve: Secondary | ICD-10-CM | POA: Diagnosis not present

## 2018-01-17 DIAGNOSIS — Z0000 Encounter for general adult medical examination without abnormal findings: Secondary | ICD-10-CM | POA: Diagnosis not present

## 2018-01-23 ENCOUNTER — Other Ambulatory Visit (HOSPITAL_COMMUNITY)
Admission: RE | Admit: 2018-01-23 | Discharge: 2018-01-23 | Disposition: A | Discharge status: Home / Self Care | Payer: 59 | Source: Ambulatory Visit | Attending: Family Medicine | Admitting: Family Medicine

## 2018-01-23 DIAGNOSIS — Z0000 Encounter for general adult medical examination without abnormal findings: Secondary | ICD-10-CM | POA: Diagnosis not present

## 2018-01-23 LAB — COMPREHENSIVE METABOLIC PANEL
ALT: 15 U/L — ABNORMAL LOW (ref 17–63)
AST: 23 U/L (ref 15–41)
Albumin: 4.3 g/dL (ref 3.5–5.0)
Alkaline Phosphatase: 58 U/L (ref 38–126)
Anion gap: 7 (ref 5–15)
BUN: 15 mg/dL (ref 6–20)
CO2: 27 mmol/L (ref 22–32)
Calcium: 9.5 mg/dL (ref 8.9–10.3)
Chloride: 104 mmol/L (ref 101–111)
Creatinine, Ser: 1.1 mg/dL (ref 0.61–1.24)
GFR calc Af Amer: >60 mL/min (ref >60)
GFR calc non Af Amer: >60 mL/min (ref >60)
Glucose, Bld: 98 mg/dL (ref 65–99)
Potassium: 3.9 mmol/L (ref 3.5–5.1)
Sodium: 138 mmol/L (ref 135–145)
Total Bilirubin: 1.4 mg/dL — ABNORMAL HIGH (ref 0.3–1.2)
Total Protein: 6.7 g/dL (ref 6.5–8.1)

## 2018-01-23 LAB — LIPID PANEL
Cholesterol: 184 mg/dL (ref 0–200)
HDL: 51 mg/dL (ref >40)
LDL Cholesterol: 120 mg/dL — ABNORMAL HIGH (ref 0–99)
Total CHOL/HDL Ratio: 3.6 RATIO
Triglycerides: 63 mg/dL (ref <150)
VLDL: 13 mg/dL (ref 0–40)

## 2018-01-23 LAB — CBC
HCT: 43.3 % (ref 39.0–52.0)
Hemoglobin: 14.6 g/dL (ref 13.0–17.0)
MCH: 31.1 pg (ref 26.0–34.0)
MCHC: 33.7 g/dL (ref 30.0–36.0)
MCV: 92.1 fL (ref 78.0–100.0)
Platelets: 248 10*3/uL (ref 150–400)
RBC: 4.7 MIL/uL (ref 4.22–5.81)
RDW: 13 % (ref 11.5–15.5)
WBC: 3.6 10*3/uL — ABNORMAL LOW (ref 4.0–10.5)

## 2018-01-23 LAB — PSA: Prostatic Specific Antigen: 2.35 ng/mL (ref 0.00–4.00)

## 2018-01-24 DIAGNOSIS — Z1211 Encounter for screening for malignant neoplasm of colon: Secondary | ICD-10-CM | POA: Diagnosis not present

## 2018-01-25 ENCOUNTER — Telehealth: Payer: Self-pay | Admitting: Cardiovascular Disease

## 2018-01-25 NOTE — Telephone Encounter (Signed)
   [facility] Pre-operative Risk Assessment    Request for surgical clearance:  1. What type of surgery is being performed?  Colonoscopy   2. When is this surgery scheduled?  02/18/2018   3. What type of clearance is required (medical clearance vs. Pharmacy clearance to hold med vs. Both)?  Both  4. Are there any medications that need to be held prior to surgery and how long? Coumadin   5. Practice name and name of physician performing surgery?  [facility] Dr. Collene Mares and Dr. Benson Norway  6. What is your office phone number? [phone]    7.   What is your office fax number? [phone]  8.   Anesthesia type (None, local, MAC, general) ? Propofol    _________________________________________________________________   (provider comments below)

## 2018-01-29 NOTE — Telephone Encounter (Signed)
Patient with diagnosis of mechanical aortic valve on warfarin for anticoagulation.    Procedure: colonoscopy Date of procedure: 02/18/18  CrCl 89.2 Platelet count 248  Per office protocol, patient can hold warfarin for 5 days prior to procedure.    Patient will not need bridging with Lovenox (enoxaparin) around procedure. (mechanical aortic valve with no atrial fibrillation or risk factors for stroke)  Patient should restart warfarin on the evening of procedure or day after, at discretion of procedure MD

## 2018-01-30 NOTE — Telephone Encounter (Signed)
   Primary Cardiologist: Dr Julianne Handler  Chart reviewed as part of pre-operative protocol coverage. Given past medical history and time since last visit, based on ACC/AHA guidelines, Daniel Hickman would be at acceptable risk for the planned procedure without further cardiovascular testing.   Ok to hold Coumadin without Lovenox bridging pre op, resume Coumadin post op pm.  I will route this recommendation to the requesting party via [place] fax function and remove from pre-op pool.  Please call with questions.  Kerin Ransom, PA-C 01/30/2018, 2:54 PM

## 2018-02-13 MED FILL — GAVILYTE-G SOLUTION: 236 | 1 days supply | Qty: 4000 | Fill #0

## 2018-02-18 DIAGNOSIS — Z1211 Encounter for screening for malignant neoplasm of colon: Secondary | ICD-10-CM | POA: Diagnosis not present

## 2018-04-03 MED FILL — WARFARIN SODIUM 4 MG TABLET: 4 | 90 days supply | Qty: 100 | Fill #0

## 2018-05-08 DIAGNOSIS — Z7901 Long term (current) use of anticoagulants: Secondary | ICD-10-CM | POA: Diagnosis not present

## 2018-07-01 MED FILL — WARFARIN SODIUM 4 MG TABLET: 4 | 90 days supply | Qty: 100 | Fill #1

## 2018-07-29 MED FILL — AMOXICILLIN 500 MG CAPSULE: 500 | 3 days supply | Qty: 12 | Fill #0

## 2018-08-30 DIAGNOSIS — Z7901 Long term (current) use of anticoagulants: Secondary | ICD-10-CM | POA: Diagnosis not present

## 2018-10-03 MED FILL — WARFARIN SODIUM 4 MG TABLET: 4 | 90 days supply | Qty: 100 | Fill #2

## 2018-11-26 MED FILL — AMOXICILLIN 500 MG CAPSULE: 500 | 3 days supply | Qty: 12 | Fill #1

## 2018-12-07 MED FILL — WARFARIN SODIUM 4 MG TABLET: 4 | 90 days supply | Qty: 100 | Fill #3

## 2018-12-11 DIAGNOSIS — Z7901 Long term (current) use of anticoagulants: Secondary | ICD-10-CM | POA: Diagnosis not present

## 2019-01-27 ENCOUNTER — Telehealth: Payer: Self-pay | Admitting: *Deleted

## 2019-01-27 ENCOUNTER — Telehealth: Payer: Self-pay | Admitting: Cardiovascular Disease

## 2019-01-27 NOTE — Progress Notes (Signed)
Virtual Visit via Telephone Note   This visit type was conducted due to national recommendations for restrictions regarding the COVID-19 Pandemic (e.g. social distancing) in an effort to limit this patient's exposure and mitigate transmission in our community.  Due to his co-morbid illnesses, this patient is at least at moderate risk for complications without adequate follow up.  This format is felt to be most appropriate for this patient at this time.  The patient did not have access to video technology/had technical difficulties with video requiring transitioning to audio format only (telephone).  All issues noted in this document were discussed and addressed.  No physical exam could be performed with this format.  Please refer to the patient's chart for his  consent to telehealth for [facility].   Date:  01/28/2019   ID:  Daniel Hickman, DOB 1960/11/01, MRN [id number]  Patient Location: Home Provider Location: Home  PCP:  Daniel Noon, MD  Cardiologist:  Daniel Chandler, MD   Electrophysiologist:  None   Evaluation Performed:  Follow-Up Visit  Chief Complaint:  Chest pain   History of Present Illness:    Daniel Hickman is a 58 y.o. male with aortic valve dz s/p mechanical AVR in 2006, prior GI bleeding, chronic anticoagulation with Coumadin.  Today, he notes he has been having exertional chest discomfort for the past 4 to 5 days.  The discomfort is mild and comes on with exertional activities like shoveling mulch.  His pain resolves promptly with rest.  The discomfort is a squeezing and it is right-sided.  He has felt some pain in his right arm.  He has not had any associated nausea or diaphoresis.  He has not had any syncope.  He does note increasing fatigue.  He has not had any significant shortness of breath, orthopnea, paroxysmal nocturnal dyspnea or lower extremity swelling.  The patient does not have symptoms concerning for COVID-19 infection (fever, chills, cough,  or new shortness of breath).    Past Medical History:  Diagnosis Date  . Anemia   . Anxiety   . Aortic insufficiency    EF 55%    , AVR 2006  . Dilated aortic root ([place])   . Drug therapy    Coumadin for mechanical aortic valve prosthesis  . Ejection fraction   . GI bleed    March, 2008, normal endoscopy and colonoscopy, question of small bowel ulcers by capsule endoscopy  . Migraine   . Mitral valve prolapse   . S/P AVR (aortic valve replacement)     Dr. Servando Hickman /      March, 2006, 27 mm St. Jude mechanical valve   Past Surgical History:  Procedure Laterality Date  . AORTIC VALVE REPLACEMENT  12/05/04     Current Meds  Medication Sig  . amoxicillin (AMOXIL) 500 MG capsule Take 500 mg by mouth as needed (for Dental procedures).   Marland Kitchen aspirin 81 MG tablet Take 81 mg by mouth as needed (only as directed by doctor).   . warfarin (COUMADIN) 4 MG tablet Take as directed by coumadiin clinic     Allergies:   Patient has no known allergies.   Social History   Tobacco Use  . Smoking status: Never Smoker  . Smokeless tobacco: Never Used  Substance Use Topics  . Alcohol use: No  . Drug use: No     Family Hx: The patient's family history includes Heart attack in his maternal grandfather; Hypertension in his brother and father; Prostate  cancer in his father; Ulcers in his mother.  ROS:   Please see the history of present illness.     All other systems reviewed and are negative.   Prior CV studies:   The following studies were reviewed today:  Echo 12/27/2010 EF 55, no RWMA, normally functioning mechanical AVR with trivial central AI  Cardiac Catheterization 11/14/2004 Normal coronary arteries  Labs/Other Tests and Data Reviewed:    EKG:  An ECG dated 01/28/2019 was personally reviewed today and demonstrated:  Normal sinus rhythm, heart rate 67, QTC 460, no acute ST-T wave changes  Recent Labs: No results found for requested labs within last 8760 hours.   Recent Lipid  Panel Lab Results  Component Value Date/Time   CHOL 184 01/23/2018 08:25 AM   CHOL 180 01/22/2017 07:56 AM   TRIG 63 01/23/2018 08:25 AM   HDL 51 01/23/2018 08:25 AM   HDL 46 01/22/2017 07:56 AM   CHOLHDL 3.6 01/23/2018 08:25 AM   LDLCALC 120 (H) 01/23/2018 08:25 AM   LDLCALC 116 (H) 01/22/2017 07:56 AM    Wt Readings from Last 3 Encounters:  01/28/19 179 lb 3.2 oz (81.3 kg)  01/16/17 185 lb 6.4 oz (84.1 kg)  01/03/16 189 lb (85.7 kg)     Objective:    Vital Signs:  BP 121/78   Pulse 76   Ht 6' (1.829 m)   Wt 179 lb 3.2 oz (81.3 kg)   SpO2 97%   BMI 24.30 kg/m    VITAL SIGNS:  reviewed GEN:  no acute distress RESPIRATORY:  No labored breathing NEURO:  Alert and oriented PSYCH:  He seems to be in good spirits  ASSESSMENT & PLAN:    Exertional chest pain [name] notes a recent history of exertional chest discomfort.  For the most part his symptoms seem to come on with exertion and are relieved by rest.  He has had some pain with positional changes as well.  His cardiac catheterization in 2006 demonstrated no coronary artery disease.  We discussed the rationale for proceeding with stress testing versus coronary CTA.  I think coronary CTA would be a helpful test to rule out coronary artery disease given he does have exertional symptoms.  I reviewed his case today with Dr. Meda Hickman to make sure that his mechanical valve would not interfere with the images.  She feels that we should be able to get good images despite his mechanical aortic valve.  Therefore, the patient will be set up for coronary CTA with FFR.  I will also obtain a follow-up echocardiogram.  I have refilled his nitroglycerin for him.  If he has any more worrisome chest discomfort prior to his study, he should contact us.  S/P AVR (aortic valve replacement)  As noted, he has had some recent exertional chest discomfort.  We have not obtained a follow-up echo on him since 2012.  A repeat echocardiogram will be  obtained.  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  Continue SBE prophylaxis.  Warfarin is managed by Dr. Terrence Dupont.  Continue aspirin.  Educated About Covid-19 Virus Infection As noted, the patient works in the hospital and is well aware of proper infection control measures.  Time:   Today, I have spent 23 minutes with the patient with telehealth technology discussing the above problems.     Medication Adjustments/Labs and Tests Ordered: Current medicines are reviewed at length  with the patient today.  Concerns regarding medicines are outlined above.   Tests Ordered: Orders Placed This Encounter  Procedures  . CT CORONARY MORPH W/CTA COR W/SCORE W/CA W/CM &/OR WO/CM  . CT CORONARY FRACTIONAL FLOW RESERVE DATA PREP  . CT CORONARY FRACTIONAL FLOW RESERVE FLUID ANALYSIS  . ECHOCARDIOGRAM COMPLETE    Medication Changes: Meds ordered this encounter  Medications  . nitroGLYCERIN (NITROSTAT) 0.4 MG SL tablet    Sig: Place 1 tablet (0.4 mg total) under the tongue every 5 (five) minutes as needed for chest pain.    Dispense:  25 tablet    Refill:  11    Order Specific Question:   Supervising Provider    Answer:   Lelon Perla [[id number]]    Disposition:  Follow up Follow-up will depend upon results of the above testing.  Signed, Richardson Dopp, PA-C  01/28/2019 4:43 PM    [facility]

## 2019-01-27 NOTE — Telephone Encounter (Signed)
New Message    Pt c/o of Chest Pain: STAT if CP now or developed within 24 hours  1. Are you having CP right now? A little, he took a baby aspirin 81mg    2. Are you experiencing any other symptoms (ex. SOB, nausea, vomiting, sweating)? No, a little SOB if he is working   3. How long have you been experiencing CP? About 3 days   4. Is your CP continuous or coming and going? Come and go   5. Have you taken Nitroglycerin? No  ?

## 2019-01-27 NOTE — Telephone Encounter (Signed)
error 

## 2019-01-27 NOTE — Telephone Encounter (Signed)
Pt on wait list for appointment with Dr. Angelena Form. I placed call to pt to see if he would like virtual office visit on May 14,2020.  Left message to call office

## 2019-01-27 NOTE — Telephone Encounter (Signed)
Pt has been scheduled to see Richardson Dopp, PA for video visit on May 12,2020.

## 2019-01-27 NOTE — Telephone Encounter (Signed)
I spoke with Daniel Hickman. He reports chest pain off and on over last 3 days.  Goes away on it's own. Lasts 30 seconds to about 3 minutes.  Describes as "light chest pain.".  Worst episode was when he was shoveling pine bark.  I scheduled Daniel Hickman to see Daniel Dopp, PA on May 12,2020 at 11:45 for video visit.  I advised Daniel Hickman to go to ED for evaluation if symptoms worsen.   Virtual Visit Pre-Appointment Phone Call  "(Name), I am calling you today to discuss your upcoming appointment. We are currently trying to limit exposure to the virus that causes COVID-19 by seeing patients at home rather than in the office."  "What is the BEST phone number to call the day of the visit?" - [phone]   1. "Do you have or have access to (through a family member/friend) a smartphone with video capability that we can use for your visit?" yes a. If yes - list this number in appt notes as "cell" (if different from BEST phone #) and list the appointment type as a VIDEO visit in appointment notes b. If no - list the appointment type as a PHONE visit in appointment notes  2. Confirm consent - "In the setting of the current Covid19 crisis, you are scheduled for a video visit with your provider on May 12,2020 at 11:45  Just as we do with many in-office visits, in order for you to participate in this visit, we must obtain consent.  If you'd like, I can send this to your mychart (if signed up) or email for you to review.  Otherwise, I can obtain your verbal consent now.  All virtual visits are billed to your insurance company just like a normal visit would be.  By agreeing to a virtual visit, we'd like you to understand that the technology does not allow for your provider to perform an examination, and thus may limit your provider's ability to fully assess your condition. If your provider identifies any concerns that need to be evaluated in person, we will make arrangements to do so.  Finally, though the technology is pretty good, we  cannot assure that it will always work on either your or our end, and in the setting of a video visit, we may have to convert it to a phone-only visit.  In either situation, we cannot ensure that we have a secure connection.  Are you willing to proceed?" STAFF: Did the patient verbally acknowledge consent to telehealth visit? Document YES/NO here: yes  3. Advise patient to be prepared - "Two hours prior to your appointment, go ahead and check your blood pressure, pulse, oxygen saturation, and your weight (if you have the equipment to check those) and write them all down. When your visit starts, your provider will ask you for this information. If you have an Apple Watch or Kardia device, please plan to have heart rate information ready on the day of your appointment. Please have a pen and paper handy nearby the day of the visit as well."  4. Give patient instructions for MyChart download to smartphone OR Doximity/Doxy.me as below if video visit (depending on what platform provider is using)  5. Inform patient they will receive a phone call 15 minutes prior to their appointment time (may be from unknown caller ID) so they should be prepared to answer    TELEPHONE CALL NOTE  [facility] QUEZADA has been deemed a candidate for a follow-up tele-health visit to limit community  exposure during the Covid-19 pandemic. I spoke with the patient via phone to ensure availability of phone/video source, confirm preferred email & phone number, and discuss instructions and expectations.  I reminded Daniel Hickman to be prepared with any vital sign and/or heart rhythm information that could potentially be obtained via home monitoring, at the time of his visit. I reminded Daniel Hickman to expect a phone call prior to his visit.  Leodis Liverpool, RN 01/27/2019 5:05 PM   INSTRUCTIONS FOR DOWNLOADING THE MYCHART APP TO SMARTPHONE  - The patient must first make sure to have activated MyChart and know their login  information - If Apple, go to [employer] and type in MyChart in the search bar and download the app. If Android, ask patient to go to [employer] and type in [place] in the search bar and download the app. The app is free but as with any other app downloads, their phone may require them to verify saved payment information or Apple/Android password.  - The patient will need to then log into the app with their MyChart username and password, and select [facility] as their healthcare provider to link the account. When it is time for your visit, go to the MyChart app, find appointments, and click Begin Video Visit. Be sure to Select Allow for your device to access the Microphone and Camera for your visit. You will then be connected, and your provider will be with you shortly.  **If they have any issues connecting, or need assistance please contact MyChart service desk [phone])**  **If using a computer, in order to ensure the best quality for their visit they will need to use either of the following Internet Browsers: [employer], or Google Chrome**  IF USING DOXIMITY or DOXY.ME - The patient will receive a link just prior to their visit by text.     FULL LENGTH CONSENT FOR TELE-HEALTH VISIT   I hereby voluntarily request, consent and authorize [facility] and its employed or contracted physicians, physician assistants, nurse practitioners or other licensed health care professionals (the Practitioner), to provide me with telemedicine health care services (the "Services") as deemed necessary by the treating Practitioner. I acknowledge and consent to receive the Services by the Practitioner via telemedicine. I understand that the telemedicine visit will involve communicating with the Practitioner through live audiovisual communication technology and the disclosure of certain medical information by electronic transmission. I acknowledge that I have been given the opportunity to  request an in-person assessment or other available alternative prior to the telemedicine visit and am voluntarily participating in the telemedicine visit.  I understand that I have the right to withhold or withdraw my consent to the use of telemedicine in the course of my care at any time, without affecting my right to future care or treatment, and that the Practitioner or I may terminate the telemedicine visit at any time. I understand that I have the right to inspect all information obtained and/or recorded in the course of the telemedicine visit and may receive copies of available information for a reasonable fee.  I understand that some of the potential risks of receiving the Services via telemedicine include:  Marland Kitchen Delay or interruption in medical evaluation due to technological equipment failure or disruption; . Information transmitted may not be sufficient (e.g. poor resolution of images) to allow for appropriate medical decision making by the Practitioner; and/or  . In rare instances, security protocols could fail, causing a breach of personal  health information.  Furthermore, I acknowledge that it is my responsibility to provide information about my medical history, conditions and care that is complete and accurate to the best of my ability. I acknowledge that Practitioner's advice, recommendations, and/or decision may be based on factors not within their control, such as incomplete or inaccurate data provided by me or distortions of diagnostic images or specimens that may result from electronic transmissions. I understand that the practice of medicine is not an exact science and that Practitioner makes no warranties or guarantees regarding treatment outcomes. I acknowledge that I will receive a copy of this consent concurrently upon execution via email to the email address I last provided but may also request a printed copy by calling the office of [facility].    I understand that my insurance  will be billed for this visit.   I have read or had this consent read to me. . I understand the contents of this consent, which adequately explains the benefits and risks of the Services being provided via telemedicine.  . I have been provided ample opportunity to ask questions regarding this consent and the Services and have had my questions answered to my satisfaction. . I give my informed consent for the services to be provided through the use of telemedicine in my medical care  By participating in this telemedicine visit I agree to the above.

## 2019-01-28 ENCOUNTER — Ambulatory Visit (HOSPITAL_COMMUNITY)
Admission: RE | Admit: 2019-01-28 | Discharge: 2019-01-28 | Disposition: A | Discharge status: Home / Self Care | Payer: 59 | Source: Ambulatory Visit | Attending: Cardiovascular Disease | Admitting: Cardiovascular Disease

## 2019-01-28 ENCOUNTER — Telehealth (INDEPENDENT_AMBULATORY_CARE_PROVIDER_SITE_OTHER): Payer: 59 | Admitting: Physician Assistant

## 2019-01-28 ENCOUNTER — Other Ambulatory Visit: Payer: Self-pay

## 2019-01-28 ENCOUNTER — Encounter: Payer: Self-pay | Admitting: Physician Assistant

## 2019-01-28 VITALS — BP 121/78 | HR 76 | Ht 72.0 in | Wt 179.2 lb

## 2019-01-28 DIAGNOSIS — Z952 Presence of prosthetic heart valve: Secondary | ICD-10-CM

## 2019-01-28 DIAGNOSIS — Z8042 Family history of malignant neoplasm of prostate: Secondary | ICD-10-CM | POA: Insufficient documentation

## 2019-01-28 DIAGNOSIS — R079 Chest pain, unspecified: Secondary | ICD-10-CM

## 2019-01-28 DIAGNOSIS — Z8249 Family history of ischemic heart disease and other diseases of the circulatory system: Secondary | ICD-10-CM | POA: Diagnosis not present

## 2019-01-28 DIAGNOSIS — Z7189 Other specified counseling: Secondary | ICD-10-CM

## 2019-01-28 MED ORDER — NITROGLYCERIN 0.4 MG SL SUBL: 0.4000 mg | SUBLINGUAL_TABLET | SUBLINGUAL | 11 refills | Status: AC | PRN

## 2019-01-28 MED FILL — NITROGLYCERIN 0.4 MG TAB SL: 0.4 | 5 days supply | Qty: 25 | Fill #0

## 2019-01-28 NOTE — Patient Instructions (Signed)
Medication Instructions:  Continue current medications. I sent in a new prescription for Nitroglycerin to [facility]  If you need a refill on your cardiac medications before your next appointment, please call your pharmacy.   Lab work: None   If you have labs (blood work) drawn today and your tests are completely normal, you will receive your results only by: Marland Kitchen MyChart Message (if you have MyChart) OR . A paper copy in the mail If you have any lab test that is abnormal or we need to change your treatment, we will call you to review the results.  Testing/Procedures: 1. Echocardiogram - You can get this done at work when they can work you in. 2. Coronary CTA - Our office will call to arrange.   Follow-Up: At [facility], you and your health needs are our priority.  As part of our continuing mission to provide you with exceptional heart care, we have created designated Provider Care Teams.  These Care Teams include your primary Cardiologist (physician) and Advanced Practice Providers (APPs -  Physician Assistants and Nurse Practitioners) who all work together to provide you with the care you need, when you need it. . Follow up with Richardson Dopp, PA-C or Dr. Angelena Form based upon the results of your tests.   Any Other Special Instructions Will Be Listed Below (If Applicable).  Call if your symptoms get any worse.

## 2019-01-28 NOTE — Telephone Encounter (Signed)
Agree. Thanks

## 2019-01-29 ENCOUNTER — Telehealth: Payer: Self-pay | Admitting: Physician Assistant

## 2019-01-29 ENCOUNTER — Encounter: Payer: Self-pay | Admitting: *Deleted

## 2019-01-29 MED ORDER — METOPROLOL TARTRATE 100 MG PO TABS: 100.0000 mg | ORAL_TABLET | Freq: Once | ORAL | 0 refills | Status: DC

## 2019-01-29 MED FILL — METOPROLOL TARTRATE 100 MG: 100 | 1 days supply | Qty: 1 | Fill #0

## 2019-01-29 NOTE — Telephone Encounter (Signed)
Left message to call and schedule cardiac ct

## 2019-01-30 ENCOUNTER — Ambulatory Visit (HOSPITAL_COMMUNITY)
Admission: RE | Admit: 2019-01-30 | Discharge: 2019-01-30 | Disposition: A | Discharge status: Home / Self Care | Payer: 59 | Source: Ambulatory Visit | Attending: Physician Assistant | Admitting: Physician Assistant

## 2019-01-30 ENCOUNTER — Other Ambulatory Visit: Payer: Self-pay

## 2019-01-30 DIAGNOSIS — Z952 Presence of prosthetic heart valve: Secondary | ICD-10-CM | POA: Insufficient documentation

## 2019-01-30 DIAGNOSIS — I77810 Thoracic aortic ectasia: Secondary | ICD-10-CM | POA: Diagnosis not present

## 2019-01-30 DIAGNOSIS — R079 Chest pain, unspecified: Secondary | ICD-10-CM | POA: Diagnosis not present

## 2019-01-30 DIAGNOSIS — I351 Nonrheumatic aortic (valve) insufficiency: Secondary | ICD-10-CM | POA: Insufficient documentation

## 2019-01-30 NOTE — Progress Notes (Signed)
  Echocardiogram 2D Echocardiogram has been performed.  Daniel Hickman 01/30/2019, 2:09 PM

## 2019-01-31 ENCOUNTER — Encounter: Payer: Self-pay | Admitting: Physician Assistant

## 2019-01-31 ENCOUNTER — Telehealth: Payer: Self-pay

## 2019-01-31 ENCOUNTER — Ambulatory Visit (HOSPITAL_COMMUNITY): Payer: 59

## 2019-01-31 NOTE — Telephone Encounter (Signed)
Notes recorded by Frederik Schmidt, RN on 01/31/2019 at 8:31 AM EDT The patient has been notified of the Echo results and verbalized understanding. All questions (if any) were answered. Frederik Schmidt, RN 01/31/2019 8:31 AM

## 2019-01-31 NOTE — Telephone Encounter (Signed)
-----   Message from Liliane Shi, [place] sent at 01/31/2019  8:07 AM EDT ----- Please call the patient. His echo shows normal ejection fraction, mildly impaired relaxation (diastolic dysfunction), normally functioning mechanical aortic valve prosthesis.   The ascending aorta is mildly enlarged.  Findings were similar on the echo in 2012.  He has a CT pending and this will help with measuring this better.  Recommendations:  - Continue current medications and follow up as planned.  Richardson Dopp, PA-C    01/31/2019 8:03 AM

## 2019-02-04 ENCOUNTER — Ambulatory Visit (HOSPITAL_COMMUNITY): Payer: 59

## 2019-02-11 ENCOUNTER — Telehealth (HOSPITAL_COMMUNITY): Payer: Self-pay | Admitting: Emergency Medicine

## 2019-02-11 NOTE — Telephone Encounter (Signed)
Left message on voicemail with name and callback number Dionisia Pacholski RN Navigator Cardiac Imaging [facility] [phone] Office [phone] Cell  

## 2019-02-12 ENCOUNTER — Ambulatory Visit (HOSPITAL_COMMUNITY)
Admission: RE | Admit: 2019-02-12 | Discharge: 2019-02-12 | Disposition: A | Discharge status: Home / Self Care | Payer: 59 | Source: Ambulatory Visit | Attending: Physician Assistant | Admitting: Physician Assistant

## 2019-02-12 ENCOUNTER — Other Ambulatory Visit: Payer: Self-pay

## 2019-02-12 DIAGNOSIS — R079 Chest pain, unspecified: Secondary | ICD-10-CM | POA: Diagnosis not present

## 2019-02-12 MED ORDER — IOHEXOL 350 MG/ML SOLN
50.0000 mL | Freq: Once | INTRAVENOUS | Status: AC | PRN
  Administered 2019-02-12: 50 mL via INTRAVENOUS

## 2019-02-12 MED ORDER — NITROGLYCERIN 0.4 MG SL SUBL
0.8000 mg | SUBLINGUAL_TABLET | Freq: Once | SUBLINGUAL | Status: AC
  Administered 2019-02-12: 0.8 mg via SUBLINGUAL
  Filled 2019-02-12: qty 25

## 2019-02-12 MED ORDER — NITROGLYCERIN 0.4 MG SL SUBL
SUBLINGUAL_TABLET | SUBLINGUAL | Status: AC
  Filled 2019-02-12: qty 2

## 2019-02-13 ENCOUNTER — Encounter: Payer: Self-pay | Admitting: Physician Assistant

## 2019-02-13 ENCOUNTER — Other Ambulatory Visit: Payer: Self-pay | Admitting: Physician Assistant

## 2019-02-13 ENCOUNTER — Telehealth: Payer: Self-pay | Admitting: Physician Assistant

## 2019-02-13 ENCOUNTER — Telehealth: Payer: Self-pay | Admitting: *Deleted

## 2019-02-13 DIAGNOSIS — I25119 Atherosclerotic heart disease of native coronary artery with unspecified angina pectoris: Secondary | ICD-10-CM

## 2019-02-13 DIAGNOSIS — I2510 Atherosclerotic heart disease of native coronary artery without angina pectoris: Secondary | ICD-10-CM

## 2019-02-13 DIAGNOSIS — I712 Thoracic aortic aneurysm, without rupture, unspecified: Secondary | ICD-10-CM

## 2019-02-13 HISTORY — DX: Atherosclerotic heart disease of native coronary artery without angina pectoris: I25.10

## 2019-02-13 MED ORDER — ATORVASTATIN CALCIUM 40 MG PO TABS: 40.0000 mg | ORAL_TABLET | Freq: Every day | ORAL | 1 refills | Status: DC

## 2019-02-13 MED FILL — ATORVASTATIN 40 MG TABLET: 40 | 90 days supply | Qty: 90 | Fill #0

## 2019-02-13 NOTE — Telephone Encounter (Signed)
SPOKE WITH PT AND PT AWARE OF RESULTS AND FASTING LABS AND CT IN A YEAR

## 2019-02-13 NOTE — Telephone Encounter (Signed)
Patient has question about his lipitor

## 2019-03-14 MED FILL — predniSONE 10 MG TABS: 10 | 7 days supply | Qty: 15 | Fill #0

## 2019-03-20 MED FILL — WARFARIN SODIUM 4 MG TABLET: 4 | 90 days supply | Qty: 100 | Fill #0

## 2019-04-10 DIAGNOSIS — Z7901 Long term (current) use of anticoagulants: Secondary | ICD-10-CM | POA: Diagnosis not present

## 2019-05-09 ENCOUNTER — Other Ambulatory Visit: Payer: 59 | Admitting: *Deleted

## 2019-05-09 DIAGNOSIS — I25119 Atherosclerotic heart disease of native coronary artery with unspecified angina pectoris: Secondary | ICD-10-CM

## 2019-05-09 LAB — LIPID PANEL
Chol/HDL Ratio: 3.6 ratio (ref 0.0–5.0)
Cholesterol, Total: 182 mg/dL (ref 100–199)
HDL: 51 mg/dL (ref >39)
LDL Calculated: 116 mg/dL — ABNORMAL HIGH (ref 0–99)
Triglycerides: 73 mg/dL (ref 0–149)
VLDL Cholesterol Cal: 15 mg/dL (ref 5–40)

## 2019-05-09 LAB — HEPATIC FUNCTION PANEL
ALT: 9 IU/L (ref 0–44)
AST: 16 IU/L (ref 0–40)
Albumin: 4.6 g/dL (ref 3.8–4.9)
Alkaline Phosphatase: 66 IU/L (ref 39–117)
Bilirubin Total: 0.9 mg/dL (ref 0.0–1.2)
Bilirubin, Direct: 0.2 mg/dL (ref 0.00–0.40)
Total Protein: 6.3 g/dL (ref 6.0–8.5)

## 2019-05-12 ENCOUNTER — Ambulatory Visit: Payer: 59 | Admitting: Cardiovascular Disease

## 2019-05-15 ENCOUNTER — Telehealth: Payer: Self-pay | Admitting: Physician Assistant

## 2019-05-15 NOTE — Telephone Encounter (Signed)
New Message ° ° ° °Patient is returning call in reference to lab results. Please call.  °

## 2019-05-15 NOTE — Telephone Encounter (Signed)
Called pt back but he stepped out... LM for him to call back and ask for triage.

## 2019-05-15 NOTE — Telephone Encounter (Signed)
A low dose of Crestor will likely be better tolerated.   DC Lipitor. Start Crestor 10 mg once daily. Obtain fasting Lipids and LFTs in 3 mos. Richardson Dopp, PA-C    05/15/2019 5:03 PM

## 2019-05-15 NOTE — Telephone Encounter (Signed)
Spoke with the pt and he reports that he stopped the Lipitor about 9 weeks ago.. he was having muscle ache and bone pain and it was much better when he stopped it... he was going to talk with Nicki Reaper if he saw him in the hospital but then so much time went by...   Will forward this new information for Richardson Dopp PA to review to see if he still wants to proceed with trying the Rosuvastatin.

## 2019-05-16 ENCOUNTER — Other Ambulatory Visit: Payer: Self-pay

## 2019-05-16 DIAGNOSIS — E7849 Other hyperlipidemia: Secondary | ICD-10-CM

## 2019-05-16 DIAGNOSIS — I25119 Atherosclerotic heart disease of native coronary artery with unspecified angina pectoris: Secondary | ICD-10-CM

## 2019-05-16 DIAGNOSIS — Z79899 Other long term (current) drug therapy: Secondary | ICD-10-CM

## 2019-05-16 MED ORDER — ROSUVASTATIN CALCIUM 10 MG PO TABS: 10.0000 mg | ORAL_TABLET | Freq: Every day | ORAL | 3 refills | Status: DC

## 2019-05-16 MED FILL — ROSUVASTATIN CALCIUM 10 MG: 10 | 90 days supply | Qty: 90 | Fill #0

## 2019-05-16 NOTE — Telephone Encounter (Addendum)
Pt called... results mailed to the pt per his request.

## 2019-05-30 MED FILL — WARFARIN SODIUM 4 MG TABLET: 4 | 88 days supply | Qty: 100 | Fill #0

## 2019-06-04 ENCOUNTER — Encounter

## 2019-07-18 MED FILL — ACYCLOVIR 5% OINTMENT: 5 | 21 days supply | Qty: 15 | Fill #0

## 2019-08-18 ENCOUNTER — Other Ambulatory Visit: Payer: Self-pay

## 2019-08-18 ENCOUNTER — Other Ambulatory Visit: Payer: 59 | Admitting: *Deleted

## 2019-08-18 DIAGNOSIS — E7849 Other hyperlipidemia: Secondary | ICD-10-CM | POA: Diagnosis not present

## 2019-08-18 DIAGNOSIS — Z79899 Other long term (current) drug therapy: Secondary | ICD-10-CM | POA: Diagnosis not present

## 2019-08-18 DIAGNOSIS — I25119 Atherosclerotic heart disease of native coronary artery with unspecified angina pectoris: Secondary | ICD-10-CM | POA: Diagnosis not present

## 2019-08-18 LAB — LIPID PANEL
Chol/HDL Ratio: 2.1 ratio (ref 0.0–5.0)
Cholesterol, Total: 125 mg/dL (ref 100–199)
HDL: 60 mg/dL (ref >39)
LDL Chol Calc (NIH): 53 mg/dL (ref 0–99)
Triglycerides: 52 mg/dL (ref 0–149)
VLDL Cholesterol Cal: 12 mg/dL (ref 5–40)

## 2019-08-18 LAB — HEPATIC FUNCTION PANEL
ALT: 15 IU/L (ref 0–44)
AST: 17 IU/L (ref 0–40)
Albumin: 4.5 g/dL (ref 3.8–4.9)
Alkaline Phosphatase: 68 IU/L (ref 39–117)
Bilirubin Total: 0.9 mg/dL (ref 0.0–1.2)
Bilirubin, Direct: 0.23 mg/dL (ref 0.00–0.40)
Total Protein: 6.2 g/dL (ref 6.0–8.5)

## 2019-08-27 MED FILL — WARFARIN SODIUM 4 MG TABLET: 4 | 88 days supply | Qty: 100 | Fill #0

## 2019-08-27 MED FILL — ROSUVASTATIN CALCIUM 10 MG: 10 | 90 days supply | Qty: 90 | Fill #1

## 2019-09-17 DIAGNOSIS — Z952 Presence of prosthetic heart valve: Secondary | ICD-10-CM | POA: Diagnosis not present

## 2019-09-17 DIAGNOSIS — I2510 Atherosclerotic heart disease of native coronary artery without angina pectoris: Secondary | ICD-10-CM | POA: Diagnosis not present

## 2019-09-17 DIAGNOSIS — Z0000 Encounter for general adult medical examination without abnormal findings: Secondary | ICD-10-CM | POA: Diagnosis not present

## 2019-10-07 ENCOUNTER — Ambulatory Visit: Payer: 59 | Attending: Internal Medicine

## 2019-10-07 DIAGNOSIS — Z20822 Contact with and (suspected) exposure to covid-19: Secondary | ICD-10-CM

## 2019-10-08 LAB — NOVEL CORONAVIRUS, NAA: SARS-CoV-2, NAA: NOT DETECTED

## 2019-11-11 DIAGNOSIS — Z7901 Long term (current) use of anticoagulants: Secondary | ICD-10-CM | POA: Diagnosis not present

## 2019-12-05 MED FILL — WARFARIN SODIUM 4 MG TABLET: 4 | 88 days supply | Qty: 100 | Fill #1

## 2019-12-05 MED FILL — ROSUVASTATIN CALCIUM 10 MG: 10 | 90 days supply | Qty: 90 | Fill #2

## 2019-12-10 DIAGNOSIS — L259 Unspecified contact dermatitis, unspecified cause: Secondary | ICD-10-CM | POA: Diagnosis not present

## 2019-12-10 MED FILL — HYDROCORTISONE 2.5% CREAM: 2.5 | 7 days supply | Qty: 30 | Fill #0

## 2019-12-10 MED FILL — predniSONE 20 MG TABS: 20 | 7 days supply | Qty: 10 | Fill #0

## 2020-02-03 DIAGNOSIS — Z7901 Long term (current) use of anticoagulants: Secondary | ICD-10-CM | POA: Diagnosis not present

## 2020-02-12 ENCOUNTER — Other Ambulatory Visit: Payer: 59

## 2020-03-02 DIAGNOSIS — H00014 Hordeolum externum left upper eyelid: Secondary | ICD-10-CM | POA: Diagnosis not present

## 2020-03-02 DIAGNOSIS — H524 Presbyopia: Secondary | ICD-10-CM | POA: Diagnosis not present

## 2020-03-02 DIAGNOSIS — H5203 Hypermetropia, bilateral: Secondary | ICD-10-CM | POA: Diagnosis not present

## 2020-03-02 MED FILL — CEPHALEXIN 500 MG CAPSULE: 500 | 10 days supply | Qty: 20 | Fill #0

## 2020-03-03 MED FILL — WARFARIN SODIUM 4 MG TABLET: 4 | 88 days supply | Qty: 100 | Fill #2

## 2020-03-03 MED FILL — ROSUVASTATIN CALCIUM 10 MG: 10 | 90 days supply | Qty: 90 | Fill #3

## 2020-05-12 DIAGNOSIS — Z7901 Long term (current) use of anticoagulants: Secondary | ICD-10-CM | POA: Diagnosis not present

## 2020-06-07 ENCOUNTER — Other Ambulatory Visit (HOSPITAL_COMMUNITY): Payer: Self-pay | Admitting: Cardiology

## 2020-06-07 MED FILL — WARFARIN SODIUM 4 MG TABLET: 4 | 88 days supply | Qty: 100 | Fill #0

## 2020-06-16 ENCOUNTER — Other Ambulatory Visit: Payer: Self-pay | Admitting: Physician Assistant

## 2020-06-16 MED FILL — ROSUVASTATIN CALCIUM 10 MG: 10 | 30 days supply | Qty: 30 | Fill #0

## 2020-07-13 ENCOUNTER — Encounter: Payer: Self-pay | Admitting: Physician Assistant

## 2020-07-13 NOTE — Progress Notes (Signed)
Virtual Visit via Telephone Note   This visit type was conducted due to national recommendations for restrictions regarding the COVID-19 Pandemic (e.g. social distancing) in an effort to limit this patient's exposure and mitigate transmission in our community.  Due to his co-morbid illnesses, this patient is at least at moderate risk for complications without adequate follow up.  This format is felt to be most appropriate for this patient at this time.  The patient did not have access to video technology/had technical difficulties with video requiring transitioning to audio format only (telephone).  All issues noted in this document were discussed and addressed.  No physical exam could be performed with this format.  Please refer to the patient's chart for his  consent to telehealth for [facility].    Date:  07/14/2020   ID:  [name], DOB September 25, 1960, MRN [id number] The patient was identified using 2 identifiers.  Patient Location: Home Provider Location: Home Office  PCP:  Chesley Noon, MD  Cardiologist:  Lauree Chandler, MD   Electrophysiologist:  None   Evaluation Performed:  Follow-Up Visit  Chief Complaint:  Follow-up (hx of AVR, CAD, TAA)    Patient Profile: Daniel Hickman is a 60 y.o. male with:  Aortic vavle disease   S/p mechanical AVR in 2006  Warfarin managed by Dr. Terrence Dupont   Thoracic aortic aneurysm   CT 01/2019: Aortic root 43 mm, Sinuses of Valsalva 40 mm >> repeat CT 1 year  Hx of GI bleeding   Chronic anticoagulation with Warfarin   MVP   Prior CV Studies: Cor CTA 01/2019:  Ca2+ score 13 (49th percentile) LAD mid <25; D2 prox 25-49;  Sinus of Valsalva dilated (40; 44 R+L non cor cusp); Ao root 43 mm  Echocardiogram 01/30/2019 EF 50-55, Gr 1 DD, normal RVSF, ascending aorta 41 mm  Echo 12/27/2010 EF 55, no RWMA, normally functioning mechanical AVR with trivial central AI  Cardiac Catheterization 11/14/2004 Normal coronary  arteries  History of Present Illness:   [name] was last seen in May 2020.  He had chest discomfort that time.  A coronary CTA was obtained and demonstrated mild nonobstructive CAD and a calcium score of 13.  Echocardiogram demonstrated normal EF with mild diastolic dysfunction.  His CT scan did demonstrate an ascending thoracic aortic aneurysm at 43 mm and dilation at the sinuses of Valsalva.  Repeat CT was recommended in 1 year.  Today, he notes he is doing well.  He has not had chest discomfort, shortness of breath, syncope.  His Coumadin is managed by Dr. Terrence Dupont.  Past Medical History:  Diagnosis Date  . Anemia   . Anxiety   . Aortic insufficiency    EF 55%    , AVR 2006  . CAD (coronary artery disease) 02/13/2019   Cor CTA 01/2019: LAD mid <25; D2 prox 25-49; Sinus of Valsalva dilated (40; 44 R+L non cor cusp); Ao root 43 mm  . Dilated aortic root (HCC)    Slight dilatation of aortic root by echo, 2006  (CT scan at that time showed that the ascending aorta was less than 40 mm. CT scan at that time also showed no evidence coarct the descending aorta )   //   Aortic root  44 mm, echo, April, 2012 // Cor CTA 01/2019: LAD mid <25; D2 prox 25-49; Sinus of Valsalva dilated (40; 44 R+L non cor cusp); Ao root 43 mm  . Drug therapy    Coumadin for mechanical  aortic valve prosthesis  . Ejection fraction   . GI bleed    March, 2008, normal endoscopy and colonoscopy, question of small bowel ulcers by capsule endoscopy  . Migraine   . Mitral valve prolapse   . S/P AVR (aortic valve replacement)     Dr. Servando Snare /      March, 2006, 27 mm [employer] Jude mechanical valve // Echo 01/2019: EF 50-55, Gr 1 DD, dilated asc Aorta 41 mm, mechanical aortic valve normally functioning (peak/mean 20/9), mild AI   Past Surgical History:  Procedure Laterality Date  . AORTIC VALVE REPLACEMENT  12/05/04     Current Meds  Medication Sig  . amoxicillin (AMOXIL) 500 MG capsule Take 500 mg by mouth as needed (for Dental  procedures).   Marland Kitchen aspirin 81 MG tablet Take 81 mg by mouth every other day.   . nitroGLYCERIN (NITROSTAT) 0.4 MG SL tablet Place 1 tablet (0.4 mg total) under the tongue every 5 (five) minutes as needed for chest pain.  . rosuvastatin (CRESTOR) 10 MG tablet Take 1 tablet by mouth daily.  Marland Kitchen warfarin (COUMADIN) 4 MG tablet Take 4 mg by mouth as directed. 1 tablet by mouth once a day Monday-Saturday and Sunday take 6 mg by mouth  . [DISCONTINUED] rosuvastatin (CRESTOR) 10 MG tablet Take 1 tablet (10 mg total) by mouth daily. Please make overdue appt with Dr. Angelena Form before anymore refills. 1st attempt     Allergies:   Patient has no known allergies.   Social History   Tobacco Use  . Smoking status: Never Smoker  . Smokeless tobacco: Never Used  Vaping Use  . Vaping Use: Never used  Substance Use Topics  . Alcohol use: No  . Drug use: No     Family Hx: The patient's family history includes Heart attack in his maternal grandfather; Hypertension in his brother and father; Prostate cancer in his father; Ulcers in his mother.  ROS:   Please see the history of present illness.    He has not had any melena, hematochezia, hematuria.   Labs/Other Tests and Data Reviewed:    EKG:  No ECG reviewed.  Recent Labs: 08/18/2019: ALT 15   Recent Lipid Panel Lab Results  Component Value Date/Time   CHOL 125 08/18/2019 09:11 AM   TRIG 52 08/18/2019 09:11 AM   HDL 60 08/18/2019 09:11 AM   CHOLHDL 2.1 08/18/2019 09:11 AM   CHOLHDL 3.6 01/23/2018 08:25 AM   LDLCALC 53 08/18/2019 09:11 AM    Wt Readings from Last 3 Encounters:  07/14/20 178 lb (80.7 kg)  01/28/19 179 lb 3.2 oz (81.3 kg)  01/16/17 185 lb 6.4 oz (84.1 kg)     Risk Assessment/Calculations:      Objective:    Vital Signs:  BP 119/72   Ht 6' (1.829 m)   Wt 178 lb (80.7 kg)   BMI 24.14 kg/m    VITAL SIGNS:  reviewed GEN:  no acute distress PSYCH:  normal affect  ASSESSMENT & PLAN:    1. Bicuspid aortic  valve 2. Nonrheumatic aortic valve insufficiency History of mechanical aortic valve replacement in 2006.  His Coumadin is managed by Dr. Terrence Dupont.  He remains on aspirin therapy.  Continue SBE prophylaxis.  Continue aspirin therapy.  3. Thoracic ascending aortic aneurysm (HCC) 4.3 cm by CT scan done in May 2020.  We discussed the importance of follow-up CT.  Arrange chest CTA.  4. Coronary artery disease involving native coronary artery of native heart without  angina pectoris Nonobstructive disease by coronary CTA in May 2020.  He is not having anginal symptoms.  Continue aspirin, statin.  5. Other hyperlipidemia LDL in November 2020 optimal.  Arrange repeat CMET, lipids.    Time:   Today, I have spent 11 minutes with the patient with telehealth technology discussing the above problems.     Medication Adjustments/Labs and Tests Ordered: Current medicines are reviewed at length with the patient today.  Concerns regarding medicines are outlined above.   Tests Ordered: Orders Placed This Encounter  Procedures  . CT ANGIO CHEST AORTA W/CM & OR WO/CM  . Comprehensive metabolic panel  . Lipid panel    Medication Changes: No orders of the defined types were placed in this encounter.   Follow Up:  In Person in 1 year(s)  Signed, Richardson Dopp, PA-C  07/14/2020 5:08 PM    [facility]

## 2020-07-14 ENCOUNTER — Telehealth (INDEPENDENT_AMBULATORY_CARE_PROVIDER_SITE_OTHER): Payer: 59 | Admitting: Physician Assistant

## 2020-07-14 ENCOUNTER — Encounter: Payer: Self-pay | Admitting: Physician Assistant

## 2020-07-14 ENCOUNTER — Telehealth: Payer: Self-pay

## 2020-07-14 ENCOUNTER — Other Ambulatory Visit: Payer: Self-pay

## 2020-07-14 VITALS — BP 119/72 | Ht 72.0 in | Wt 178.0 lb

## 2020-07-14 DIAGNOSIS — I7121 Aneurysm of the ascending aorta, without rupture: Secondary | ICD-10-CM

## 2020-07-14 DIAGNOSIS — I2510 Atherosclerotic heart disease of native coronary artery without angina pectoris: Secondary | ICD-10-CM | POA: Diagnosis not present

## 2020-07-14 DIAGNOSIS — Q231 Congenital insufficiency of aortic valve: Secondary | ICD-10-CM

## 2020-07-14 DIAGNOSIS — E7849 Other hyperlipidemia: Secondary | ICD-10-CM | POA: Diagnosis not present

## 2020-07-14 DIAGNOSIS — I712 Thoracic aortic aneurysm, without rupture: Secondary | ICD-10-CM | POA: Diagnosis not present

## 2020-07-14 DIAGNOSIS — I351 Nonrheumatic aortic (valve) insufficiency: Secondary | ICD-10-CM

## 2020-07-14 NOTE — Telephone Encounter (Signed)
  Patient Consent for Virtual Visit         Daniel Hickman has provided verbal consent on 07/14/2020 for a virtual visit (video or telephone).   CONSENT FOR VIRTUAL VISIT FOR:  Daniel Hickman  By participating in this virtual visit I agree to the following:  I hereby voluntarily request, consent and authorize [facility] and its employed or contracted physicians, physician assistants, nurse practitioners or other licensed health care professionals (the Practitioner), to provide me with telemedicine health care services (the "Services") as deemed necessary by the treating Practitioner. I acknowledge and consent to receive the Services by the Practitioner via telemedicine. I understand that the telemedicine visit will involve communicating with the Practitioner through live audiovisual communication technology and the disclosure of certain medical information by electronic transmission. I acknowledge that I have been given the opportunity to request an in-person assessment or other available alternative prior to the telemedicine visit and am voluntarily participating in the telemedicine visit.  I understand that I have the right to withhold or withdraw my consent to the use of telemedicine in the course of my care at any time, without affecting my right to future care or treatment, and that the Practitioner or I may terminate the telemedicine visit at any time. I understand that I have the right to inspect all information obtained and/or recorded in the course of the telemedicine visit and may receive copies of available information for a reasonable Hickman.  I understand that some of the potential risks of receiving the Services via telemedicine include:  Marland Kitchen Delay or interruption in medical evaluation due to technological equipment failure or disruption; . Information transmitted may not be sufficient (e.g. poor resolution of images) to allow for appropriate medical decision making by the Practitioner;  and/or  . In rare instances, security protocols could fail, causing a breach of personal health information.  Furthermore, I acknowledge that it is my responsibility to provide information about my medical history, conditions and care that is complete and accurate to the best of my ability. I acknowledge that Practitioner's advice, recommendations, and/or decision may be based on factors not within their control, such as incomplete or inaccurate data provided by me or distortions of diagnostic images or specimens that may result from electronic transmissions. I understand that the practice of medicine is not an exact science and that Practitioner makes no warranties or guarantees regarding treatment outcomes. I acknowledge that a copy of this consent can be made available to me via my patient portal ([facility]), or I can request a printed copy by calling the office of [facility].    I understand that my insurance will be billed for this visit.   I have read or had this consent read to me. . I understand the contents of this consent, which adequately explains the benefits and risks of the Services being provided via telemedicine.  . I have been provided ample opportunity to ask questions regarding this consent and the Services and have had my questions answered to my satisfaction. . I give my informed consent for the services to be provided through the use of telemedicine in my medical care

## 2020-07-14 NOTE — Patient Instructions (Signed)
Medication Instructions:  Your physician recommends that you continue on your current medications as directed. Please refer to the Current Medication list given to you today.  *If you need a refill on your cardiac medications before your next appointment, please call your pharmacy*  Lab Work: Your physician recommends that you return for lab work in November for CMET and fasting lipids  If you have labs (blood work) drawn today and your tests are completely normal, you will receive your results only by: Marland Kitchen MyChart Message (if you have MyChart) OR . A paper copy in the mail If you have any lab test that is abnormal or we need to change your treatment, we will call you to review the results.  Testing/Procedures: Your provider wants you to have a chest CT.  Follow-Up: At [facility], you and your health needs are our priority.  As part of our continuing mission to provide you with exceptional heart care, we have created designated Provider Care Teams.  These Care Teams include your primary Cardiologist (physician) and Advanced Practice Providers (APPs -  Physician Assistants and Nurse Practitioners) who all work together to provide you with the care you need, when you need it.  Your next appointment:   12 month(s)  The format for your next appointment:   In Person  Provider:   Lauree Chandler, MD

## 2020-07-19 ENCOUNTER — Other Ambulatory Visit: Payer: Self-pay | Admitting: Cardiovascular Disease

## 2020-07-19 MED FILL — ACYCLOVIR 5% OINTMENT: 5 | 21 days supply | Qty: 15 | Fill #0

## 2020-07-19 MED FILL — ROSUVASTATIN CALCIUM 10 MG: 10 | 90 days supply | Qty: 90 | Fill #0

## 2020-08-04 ENCOUNTER — Inpatient Hospital Stay: Admission: RE | Admit: 2020-08-04 | Payer: 59 | Source: Ambulatory Visit

## 2020-08-05 DIAGNOSIS — Z7901 Long term (current) use of anticoagulants: Secondary | ICD-10-CM | POA: Diagnosis not present

## 2020-08-11 ENCOUNTER — Other Ambulatory Visit (HOSPITAL_COMMUNITY): Payer: Self-pay | Admitting: Optometry

## 2020-08-11 MED FILL — CEPHALEXIN 500 MG CAPSULE: 500 | 10 days supply | Qty: 20 | Fill #0

## 2020-08-30 ENCOUNTER — Telehealth: Payer: Self-pay | Admitting: Cardiovascular Disease

## 2020-08-30 NOTE — Telephone Encounter (Signed)
Pt set up to see Nicki Reaper tomorrow. Thanks

## 2020-08-30 NOTE — Telephone Encounter (Signed)
Pt c/o of Chest Pain: STAT if CP now or developed within 24 hours  1. Are you having CP right now? no  2. Are you experiencing any other symptoms (ex. SOB, nausea, vomiting, sweating)? Lightheaded off and on   3. How long have you been experiencing CP? Started after thursday  4. Is your CP continuous or coming and going? Comes and goes  5. Have you taken Nitroglycerin? no ? Patient states he went up a flight of stairs on Thursday and got very lightheaded. He states since then he has been having the light headedness off and on. He states he has been getting chest pain on and off since then as well.

## 2020-08-30 NOTE — Progress Notes (Signed)
Cardiology Office Note   Date:  08/31/2020   ID:  Daniel Hickman, DOB [birth year], MRN [id number]  PCP:  Daniel Noon, MD  Cardiologist:  Daniel Hickman     Chief Complaint  Patient presents with  . Chest Pain      History of Present Illness: Daniel Hickman is a 59 y.o. male who presents for lightheadedness off and on and chest pressure with waking up the stairs last Thursday   Aortic vavle disease  ? S/p mechanical AVR in 2006 ? Warfarin managed by Dr. Terrence Hickman   Thoracic aortic aneurysm  ? CT 01/2019: Aortic root 43 mm, Sinuses of Valsalva 40 mm >> repeat CT 1 year  Hx of GI bleeding   Chronic anticoagulation with Warfarin   MVP   Prior CV Studies: Cor CTA 01/2019:  Ca2+ score 13 (49th percentile) LAD mid <25; D2 prox 25-49;  Sinus of Valsalva dilated (40; 44 R+L non cor cusp); Ao root 43 mm Normal caliber mid ascending aorta, 37 mm.  Echocardiogram 01/30/2019 EF 50-55, Gr 1 DD, normal RVSF, ascending aorta 41 mm  Echo 12/27/2010 EF 55, no RWMA, normally functioning mechanical AVR with trivial central AI  Cardiac Catheterization2/27/2006 Normal coronary arteries  Today after walking up 3 flights of steps he had lightheadedness, then the room was spinning.  But had not felt well since that time. Some fatigue.  And now episodic dizziness (lightheadedness)   He did have some chest discomfort Lt upper chest with stress of family illness and now has resolved.  A cousin with hx of MVR had severe infection and died at 58.   Daniel Hickman was supposed to have chest CTA for aortic root dilatation several months ago as well but not yet done.  No rapid HR no palpitations.     Past Medical History:  Diagnosis Date  . Anemia   . Anxiety   . Aortic insufficiency    EF 55%    , AVR 2006  . CAD (coronary artery disease) 02/13/2019   Cor CTA 01/2019: LAD mid <25; D2 prox 25-49; Sinus of Valsalva dilated (40; 44 R+L non cor cusp); Ao root 43 mm  . Dilated aortic root (HCC)     Slight dilatation of aortic root by echo, 2006  (CT scan at that time showed that the ascending aorta was less than 40 mm. CT scan at that time also showed no evidence coarct the descending aorta )   //   Aortic root  44 mm, echo, April, 2012 // Cor CTA 01/2019: LAD mid <25; D2 prox 25-49; Sinus of Valsalva dilated (40; 44 R+L non cor cusp); Ao root 43 mm  . Drug therapy    Coumadin for mechanical aortic valve prosthesis  . Ejection fraction   . GI bleed    March, 2008, normal endoscopy and colonoscopy, question of small bowel ulcers by capsule endoscopy  . Migraine   . Mitral valve prolapse   . S/P AVR (aortic valve replacement)     Dr. Servando Hickman /      March, 2006, 27 mm St. Jude mechanical valve // Echo 01/2019: EF 50-55, Gr 1 DD, dilated asc Aorta 41 mm, mechanical aortic valve normally functioning (peak/mean 20/9), mild AI    Past Surgical History:  Procedure Laterality Date  . AORTIC VALVE REPLACEMENT  12/05/04     Current Outpatient Medications  Medication Sig Dispense Refill  . amoxicillin (AMOXIL) 500 MG capsule Take 500 mg by mouth as needed (for Dental  procedures).     Marland Kitchen aspirin 81 MG tablet Take 81 mg by mouth every other day.    . nitroGLYCERIN (NITROSTAT) 0.4 MG SL tablet Place 1 tablet (0.4 mg total) under the tongue every 5 (five) minutes as needed for chest pain. 25 tablet 11  . rosuvastatin (CRESTOR) 10 MG tablet TAKE 1 TABLET (10 MG TOTAL) BY MOUTH DAILY. MAKE APT WITH DR. Angelena Hickman FOR MORE REFILLS 90 tablet 3  . warfarin (COUMADIN) 4 MG tablet Take 4 mg by mouth as directed. 1 tablet by mouth once a day Monday-Saturday and Sunday take 6 mg by mouth  3   No current facility-administered medications for this visit.    Allergies:   Patient has no known allergies.    Social History:  The patient  reports that he has never smoked. He has never used smokeless tobacco. He reports that he does not drink alcohol and does not use drugs.   Family History:  The patient's family  history includes Heart attack in his maternal grandfather; Hypertension in his brother and father; Prostate cancer in his father; Ulcers in his mother.    ROS:  General:no colds or fevers, no weight changes but fatigue and some nausea  Skin:no rashes or ulcers HEENT:no blurred vision, no congestion CV:see HPI PUL:see HPI GI:no diarrhea constipation or melena, no indigestion GU:no hematuria, no dysuria MS:no joint pain, no claudication Neuro:no syncope, + lightheadedness Endo:no diabetes, no thyroid disease  Wt Readings from Last 3 Encounters:  08/31/20 181 lb (82.1 kg)  07/14/20 178 lb (80.7 kg)  01/28/19 179 lb 3.2 oz (81.3 kg)     PHYSICAL EXAM: VS:  BP 120/82   Pulse 63   Ht 6' (1.829 m)   Wt 181 lb (82.1 kg)   SpO2 99%   BMI 24.55 kg/m  , BMI Body mass index is 24.55 kg/m. General:Pleasant affect, NAD Skin:Warm and dry, brisk capillary refill HEENT:normocephalic, sclera clear, mucus membranes moist Neck:supple, no JVD, no bruits  Heart:S1S2 RRR without murmur, + click of valve, no gallup, rub or click Lungs:clear without rales, rhonchi, or wheezes ZWC:HENI, non tender, + BS, do not palpate liver spleen or masses Ext:no lower ext edema, 2+ pedal pulses, 2+ radial pulses Neuro:alert and oriented X 3, MAE, follows commands, + facial symmetry    EKG:  EKG is ordered today. The ekg ordered today demonstrates SR with minimal voltage criteria for LVH.  No acute changes.   Recent Labs: No results found for requested labs within last 8760 hours.    Lipid Panel    Component Value Date/Time   CHOL 125 08/18/2019 0911   TRIG 52 08/18/2019 0911   HDL 60 08/18/2019 0911   CHOLHDL 2.1 08/18/2019 0911   CHOLHDL 3.6 01/23/2018 0825   VLDL 13 01/23/2018 0825   LDLCALC 53 08/18/2019 0911       Other studies Reviewed: Additional studies/ records that were reviewed today include: . Echo 01/30/19 IMPRESSIONS    1. The left ventricle has low normal systolic function,  with an ejection  fraction of 50-55%. The cavity size was normal. Left ventricular diastolic  Doppler parameters are consistent with impaired relaxation.  2. The right ventricle has normal systolic function. The cavity was  normal. There is no increase in right ventricular wall thickness.  3. There is dilatation of the ascending aorta measuring 41 mm.  4. When compared to the prior study: No significant change since the  prior study in 2012.   SUMMARY  LVEF 55%, mechanical aortic valve seem sto be functioning well with  normal transaortic gradients peak/mean 20/9 mmHg, mild aortic  regurgitation.  FINDINGS  Left Ventricle: The left ventricle has low normal systolic function, with  an ejection fraction of 50-55%. The cavity size was normal. There is no  increase in left ventricular wall thickness. Left ventricular diastolic  Doppler parameters are consistent  with impaired relaxation. Normal left ventricular filling pressures   Right Ventricle: The right ventricle has normal systolic function. The  cavity was normal. There is no increase in right ventricular wall  thickness.   Left Atrium: Left atrial size was normal in size.   Right Atrium: Right atrial size was normal in size. Right atrial pressure  is estimated at 10 mmHg.   Interatrial Septum: No atrial level shunt detected by color flow Doppler.   Pericardium: There is no evidence of pericardial effusion.   Mitral Valve: The mitral valve is normal in structure. Mitral valve  regurgitation is trivial by color flow Doppler.   Tricuspid Valve: The tricuspid valve is normal in structure. Tricuspid  valve regurgitation is trivial by color flow Doppler.   Aortic Valve: The aortic valve has been replaced with St Jude mechanical  valve. Aortic valve regurgitation is mild by color flow Doppler.   Pulmonic Valve: The pulmonic valve was grossly normal. Pulmonic valve  regurgitation is trivial by color flow Doppler.    Aorta: There is dilatation of the ascending aorta measuring 41 mm.   Venous: The inferior vena cava is normal in size with greater than 50%  respiratory variability.   Compared to previous exam: No significant change since the prior study in  2012.   Cardiac CTA: 02/12/2019    IMPRESSION: 1. Mild CAD in proximal second diagonal branch, CADRADS = 2.  2. The patient's coronary artery calcium score is 13, which places the patient in the 49th percentile. This is average for what is expected for age and sex matched peers.  3. Normal coronary origin with left dominance.  4. Dilated sinuses of Valsalva, R-L: 40 mm L-Non: 44 mm R-Non: 44 mm. As compared to the report from 2006, this may not have significantly changed. Side by side comparison of images not able to be performed. Normal caliber mid ascending aorta, 37 mm.  Over-read of CTA  IMPRESSION: Aneurysmal dilatation of the aortic root, 4.3 cm maximally. This is stable since 2006. Recommend annual imaging followup by CTA or MRA.   ASSESSMENT AND PLAN:  1.  Dizziness combined room spinning and lightheadedness first occurred after climbing steps.  Has a cold feeling, with some nausea.  No awareness of rapid heart rate of palpations.  He also had mild chest discomfort that has since resolved.   No loud murmurs on exam.  EKG with mild LVH but no acute changes.  BP is stable.  It is time for his chest CTA to re eval his dilated aortic root.  Will have this done.  Will check carotids as well.    2.  CAD with cardiac CTA in 01/2019 with mild CAD in proximal 2nd diag and coronary artery calcium score of 13.   Placed on statin and has not had follow up lipids, will due today with CMP. Hx of normal cors on cath 2006   3.  AVR with st jude mechanical valve.  Placed in 2006 and last echo 01/2019 with stable valve, mildly impaired relaxation.  On coumadin and last INR 2.5.  No loud murmurs heard  on exam.  4.  Cold symptoms with nausea and  asking about covid test.  He will do at [facility] and if + call [facility] for employees.  Discussed meclizine if inner ear causing.  He prefers to hold off for now.     5.  Thoracic ascending aortic aneurysm.  4.3 cm by CT of 01/2019.  Will repeat chest CTA on ASA and statin  6.  HLD see above #2    Current medicines are reviewed with the patient today.  The patient Has no concerns regarding medicines.  The following changes have been made:  See above Labs/ tests ordered today include:see above  Disposition:   FU:  see above  Signed, Cecilie Kicks, NP  08/31/2020 12:00 PM    [facility], [place], [place], [place] Phone: [phone]; Fax: [phone]

## 2020-08-30 NOTE — Telephone Encounter (Signed)
Attempted phone call to pt at work phone number and call was sent to voicemail.  Attempted phone call to mobile phone number and left message for pt to contact triage RN at [phone].  Pt last visit with Richardson Dopp, PA-C 06/2020.  Will forward information to Dr Angelena Form and RN for review and further recommendation.

## 2020-08-31 ENCOUNTER — Encounter: Payer: Self-pay | Admitting: Cardiology

## 2020-08-31 ENCOUNTER — Ambulatory Visit: Payer: 59 | Admitting: Cardiology

## 2020-08-31 ENCOUNTER — Ambulatory Visit: Payer: 59 | Admitting: Physician Assistant

## 2020-08-31 ENCOUNTER — Other Ambulatory Visit: Payer: Self-pay

## 2020-08-31 VITALS — BP 120/82 | HR 63 | Ht 72.0 in | Wt 181.0 lb

## 2020-08-31 DIAGNOSIS — R42 Dizziness and giddiness: Secondary | ICD-10-CM

## 2020-08-31 DIAGNOSIS — I712 Thoracic aortic aneurysm, without rupture: Secondary | ICD-10-CM | POA: Diagnosis not present

## 2020-08-31 DIAGNOSIS — R072 Precordial pain: Secondary | ICD-10-CM

## 2020-08-31 DIAGNOSIS — I77810 Thoracic aortic ectasia: Secondary | ICD-10-CM

## 2020-08-31 DIAGNOSIS — I351 Nonrheumatic aortic (valve) insufficiency: Secondary | ICD-10-CM | POA: Diagnosis not present

## 2020-08-31 DIAGNOSIS — I7121 Aneurysm of the ascending aorta, without rupture: Secondary | ICD-10-CM

## 2020-08-31 DIAGNOSIS — Z952 Presence of prosthetic heart valve: Secondary | ICD-10-CM | POA: Diagnosis not present

## 2020-08-31 DIAGNOSIS — I2510 Atherosclerotic heart disease of native coronary artery without angina pectoris: Secondary | ICD-10-CM | POA: Diagnosis not present

## 2020-08-31 NOTE — Patient Instructions (Signed)
Medication Instructions:  Your physician recommends that you continue on your current medications as directed. Please refer to the Current Medication list given to you today.  *If you need a refill on your cardiac medications before your next appointment, please call your pharmacy*  Lab Work: You will have labs drawn today: CMET/Lipids  If you have labs (blood work) drawn today and your tests are completely normal, you will receive your results only by: Marland Kitchen MyChart Message (if you have MyChart) OR . A paper copy in the mail If you have any lab test that is abnormal or we need to change your treatment, we will call you to review the results.  Testing/Procedures: Your physician has requested that you have a carotid duplex. This test is an ultrasound of the carotid arteries in your neck. It looks at blood flow through these arteries that supply the brain with blood. Allow one hour for this exam. There are no restrictions or special instructions.  Your physician has requested you have a CT scan.  Follow-Up: On 09/28/2020 at 11:45PM with Richardson Dopp, PA-C

## 2020-09-01 LAB — COMPREHENSIVE METABOLIC PANEL
ALT: 19 IU/L (ref 0–44)
AST: 22 IU/L (ref 0–40)
Albumin/Globulin Ratio: 1.9 (ref 1.2–2.2)
Albumin: 4.6 g/dL (ref 3.8–4.9)
Alkaline Phosphatase: 73 IU/L (ref 44–121)
BUN/Creatinine Ratio: 12 (ref 9–20)
BUN: 11 mg/dL (ref 6–24)
Bilirubin Total: 1 mg/dL (ref 0.0–1.2)
CO2: 27 mmol/L (ref 20–29)
Calcium: 10.2 mg/dL (ref 8.7–10.2)
Chloride: 103 mmol/L (ref 96–106)
Creatinine, Ser: 0.95 mg/dL (ref 0.76–1.27)
GFR calc Af Amer: 102 mL/min/{1.73_m2} (ref >59)
GFR calc non Af Amer: 88 mL/min/{1.73_m2} (ref >59)
Globulin, Total: 2.4 g/dL (ref 1.5–4.5)
Glucose: 88 mg/dL (ref 65–99)
Potassium: 4.7 mmol/L (ref 3.5–5.2)
Sodium: 142 mmol/L (ref 134–144)
Total Protein: 7 g/dL (ref 6.0–8.5)

## 2020-09-01 LAB — LIPID PANEL
Chol/HDL Ratio: 2.6 ratio (ref 0.0–5.0)
Cholesterol, Total: 154 mg/dL (ref 100–199)
HDL: 60 mg/dL (ref >39)
LDL Chol Calc (NIH): 80 mg/dL (ref 0–99)
Triglycerides: 69 mg/dL (ref 0–149)
VLDL Cholesterol Cal: 14 mg/dL (ref 5–40)

## 2020-09-06 MED FILL — WARFARIN SODIUM 4 MG TABLET: 4 | 88 days supply | Qty: 100 | Fill #1

## 2020-09-08 ENCOUNTER — Other Ambulatory Visit: Payer: Self-pay

## 2020-09-08 ENCOUNTER — Ambulatory Visit (HOSPITAL_COMMUNITY)
Admission: RE | Admit: 2020-09-08 | Discharge: 2020-09-08 | Disposition: A | Discharge status: Home / Self Care | Payer: 59 | Source: Ambulatory Visit | Attending: Cardiology | Admitting: Cardiology

## 2020-09-08 DIAGNOSIS — I2510 Atherosclerotic heart disease of native coronary artery without angina pectoris: Secondary | ICD-10-CM | POA: Diagnosis not present

## 2020-09-08 DIAGNOSIS — R42 Dizziness and giddiness: Secondary | ICD-10-CM

## 2020-09-08 NOTE — Progress Notes (Signed)
Carotid artery duplex completed. Refer to "CV Proc" under chart review to view preliminary results.  09/08/2020 10:25 AM Kelby Aline., MHA, RVT, RDCS, RDMS

## 2020-09-09 ENCOUNTER — Telehealth: Payer: Self-pay | Admitting: Cardiology

## 2020-09-09 NOTE — Telephone Encounter (Signed)
     Pt is returning call to get result, he said if he unable to answer phone to leave him a message

## 2020-09-09 NOTE — Telephone Encounter (Signed)
See carotid results

## 2020-09-15 ENCOUNTER — Ambulatory Visit (INDEPENDENT_AMBULATORY_CARE_PROVIDER_SITE_OTHER)
Admission: RE | Admit: 2020-09-15 | Discharge: 2020-09-15 | Disposition: A | Discharge status: Home / Self Care | Payer: 59 | Source: Ambulatory Visit | Attending: Physician Assistant | Admitting: Physician Assistant

## 2020-09-15 ENCOUNTER — Other Ambulatory Visit: Payer: Self-pay

## 2020-09-15 DIAGNOSIS — I7121 Aneurysm of the ascending aorta, without rupture: Secondary | ICD-10-CM

## 2020-09-15 DIAGNOSIS — I712 Thoracic aortic aneurysm, without rupture: Secondary | ICD-10-CM

## 2020-09-15 DIAGNOSIS — Z01818 Encounter for other preprocedural examination: Secondary | ICD-10-CM | POA: Diagnosis not present

## 2020-09-15 MED ORDER — IOHEXOL 350 MG/ML SOLN
100.0000 mL | Freq: Once | INTRAVENOUS | Status: AC | PRN
  Administered 2020-09-15: 100 mL via INTRAVENOUS

## 2020-09-16 ENCOUNTER — Encounter: Payer: Self-pay | Admitting: Physician Assistant

## 2020-09-27 NOTE — Progress Notes (Signed)
Cardiology Office Note:    Date:  09/28/2020   ID:  [name], DOB 06/09/1961, MRN [id number]  PCP:  Chesley Noon, MD  [facility] Cardiologist:  Lauree Chandler, MD  Agency Electrophysiologist:  None   Referring MD: Chesley Noon, MD   Chief Complaint:  Follow-up (Dizziness)    Patient Profile:    Daniel Hickman is a 60 y.o. male with:   Aortic vavle disease  ? S/p mechanical AVR in 2006 ? Warfarin managed by Dr. Terrence Dupont   Thoracic aortic aneurysm  ? CT 01/2019: Aortic root 43 mm, Sinuses of Valsalva 40 mm >> repeat CT 1 year  Hx of GI bleeding   Chronic anticoagulation with Warfarin   MVP   Prior CV Studies: Chest CTA 09/15/2020  Ascending aorta 3.9 cm Follow-up 1 year  Carotid US 09/08/2020 Bilateral ICA minimal thickening or plaque without significant stenosis; bilateral VA with antegrade flow; normal subclavian arteries  Cor CTA 01/2019:  Ca2+ score 13 (49th percentile) LAD mid <25; D2 prox 25-49;  Sinus of Valsalva dilated (40; 44 R+L non cor cusp); Ao root 43 mm  Echocardiogram 01/30/2019 EF 50-55, Gr 1 DD, normal RVSF, ascending aorta 41 mm  Echo 12/27/2010 EF 55, no RWMA, normally functioning mechanical AVR with trivial central AI  Cardiac Catheterization2/27/2006 Normal coronary arteries  History of Present Illness:    Daniel Hickman was last seen in clinic 08/31/2020 by Cecilie Kicks, NP.  He presented with symptoms of dizziness and spinning sensation.  He also had some URI symptoms.  His chest CT was obtained shortly after this and demonstrated stable ascending thoracic aortic aneurysm.  Carotid ultrasound was obtained and demonstrated no significant ICA stenosis.  He returns for follow-up.  He is here alone.  His dizziness has resolved.  He notes that he had a clear spinning sensation when he walked up the steps at work.  For a couple days after this, he could reproduce symptoms with turning his head in 1 direction.  The  symptoms have resolved and he has not had a recurrence.   He has not had chest pain, shortness of breath, syncope.  He did have several side effects related to his mRNA COVID-19 vaccine booster.  He still has some residual issues with his oral mucosa.  These seem to be resolving after treatment initiated by his dentist.  Past Medical History:  Diagnosis Date  . Anemia   . Anxiety   . Aortic insufficiency    EF 55%    , AVR 2006  . CAD (coronary artery disease) 02/13/2019   Cor CTA 01/2019: LAD mid <25; D2 prox 25-49; Sinus of Valsalva dilated (40; 44 R+L non cor cusp); Ao root 43 mm  . Dilated aortic root (HCC)    Slight dilatation of aortic root by echo, 2006  (CT scan at that time showed that the ascending aorta was less than 40 mm. CT scan at that time also showed no evidence coarct the descending aorta )   //   Aortic root  44 mm, echo, April, 2012 // Cor CTA 01/2019: LAD mid <25; D2 prox 25-49; Sinus of Valsalva dilated (40; 44 R+L non cor cusp); Ao root 43 mm // Chest CT 12/21: ascending aorta 39 mm   . Drug therapy    Coumadin for mechanical aortic valve prosthesis  . Ejection fraction   . GI bleed    March, 2008, normal endoscopy and colonoscopy, question of small bowel ulcers by  capsule endoscopy  . Migraine   . Mitral valve prolapse   . S/P AVR (aortic valve replacement)     Dr. Servando Snare /      March, 2006, 27 mm St. Jude mechanical valve // Echo 01/2019: EF 50-55, Gr 1 DD, dilated asc Aorta 41 mm, mechanical aortic valve normally functioning (peak/mean 20/9), mild AI    Current Medications: Current Meds  Medication Sig  . amoxicillin (AMOXIL) 500 MG capsule Take 500 mg by mouth as needed (for Dental procedures).   Marland Kitchen aspirin 81 MG tablet Take 81 mg by mouth every other day.  . nitroGLYCERIN (NITROSTAT) 0.4 MG SL tablet Place 1 tablet (0.4 mg total) under the tongue every 5 (five) minutes as needed for chest pain.  . rosuvastatin (CRESTOR) 10 MG tablet Take 10 mg by mouth daily.   Marland Kitchen warfarin (COUMADIN) 4 MG tablet Take 4 mg by mouth as directed. 1 tablet by mouth once a day Monday-Saturday and Sunday take 6 mg by mouth     Allergies:   Patient has no known allergies.   Social History   Tobacco Use  . Smoking status: Never Smoker  . Smokeless tobacco: Never Used  Vaping Use  . Vaping Use: Never used  Substance Use Topics  . Alcohol use: No  . Drug use: No     Family Hx: The patient's family history includes Heart attack in his maternal grandfather; Hypertension in his brother and father; Prostate cancer in his father; Ulcers in his mother.  ROS   EKGs/Labs/Other Test Reviewed:    EKG:  EKG is not ordered today.  The ekg ordered today demonstrates n/a  Recent Labs: 08/31/2020: ALT 19; BUN 11; Creatinine, Ser 0.95; Potassium 4.7; Sodium 142   Recent Lipid Panel Lab Results  Component Value Date/Time   CHOL 154 08/31/2020 12:34 PM   TRIG 69 08/31/2020 12:34 PM   HDL 60 08/31/2020 12:34 PM   CHOLHDL 2.6 08/31/2020 12:34 PM   CHOLHDL 3.6 01/23/2018 08:25 AM   LDLCALC 80 08/31/2020 12:34 PM      Risk Assessment/Calculations:      Physical Exam:    VS:  BP 110/70   Pulse 68   Ht 6' (1.829 m)   Wt 179 lb 10.1 oz (81.5 kg)   SpO2 98%   BMI 24.36 kg/m     Wt Readings from Last 3 Encounters:  09/28/20 179 lb 10.1 oz (81.5 kg)  08/31/20 181 lb (82.1 kg)  07/14/20 178 lb (80.7 kg)     Constitutional:      Appearance: Healthy appearance. Not in distress.  Neck:     Vascular: JVD normal.  Pulmonary:     Effort: Pulmonary effort is normal.     Breath sounds: No wheezing. No rales.  Cardiovascular:     Normal rate. Regular rhythm. Normal S1. S2. Mechanical      Murmurs: There is no murmur.  Edema:    Peripheral edema absent.  Abdominal:     Palpations: Abdomen is soft.  Skin:    General: Skin is warm and dry.  Neurological:     General: No focal deficit present.     Mental Status: Alert and oriented to person, place and time.      Cranial Nerves: Cranial nerves are intact.      ASSESSMENT & PLAN:    1. Benign paroxysmal positional vertigo, unspecified laterality He was recently seen for episodes of dizziness.  He describes fairly clear symptoms of vertigo.  We discussed the pathophysiology and management of vertigo.  His symptoms have resolved.  If he has recurrent symptoms, he can follow-up with primary care.  If he has significant symptoms, he could be referred to physical therapy for vestibular rehabilitation.  2. S/P AVR (aortic valve replacement) Normally functioning aortic valve prosthesis by echocardiogram May 2020.  Continue aspirin, warfarin.  Continue SBE prophylaxis.  3. Thoracic ascending aortic aneurysm (HCC) Ascending aorta 3.9 cm by CT scan in December 2021.  Repeat CT in 1 year.    Dispo:  Return in about 1 year (around 09/28/2021) for Routine Follow Up with Dr. Angelena Form, or Richardson Dopp, PA-C.   Medication Adjustments/Labs and Tests Ordered: Current medicines are reviewed at length with the patient today.  Concerns regarding medicines are outlined above.  Tests Ordered: No orders of the defined types were placed in this encounter.  Medication Changes: No orders of the defined types were placed in this encounter.   Signed, Richardson Dopp, PA-C  09/28/2020 5:04 PM    [facility] [place], [place], [place]  [address] Phone: [phone]; Fax: [phone]

## 2020-09-28 ENCOUNTER — Other Ambulatory Visit: Payer: Self-pay

## 2020-09-28 ENCOUNTER — Encounter: Payer: Self-pay | Admitting: Physician Assistant

## 2020-09-28 ENCOUNTER — Ambulatory Visit (INDEPENDENT_AMBULATORY_CARE_PROVIDER_SITE_OTHER): Payer: 59 | Admitting: Physician Assistant

## 2020-09-28 VITALS — BP 110/70 | HR 68 | Ht 72.0 in | Wt 179.6 lb

## 2020-09-28 DIAGNOSIS — H8110 Benign paroxysmal vertigo, unspecified ear: Secondary | ICD-10-CM | POA: Diagnosis not present

## 2020-09-28 DIAGNOSIS — Z952 Presence of prosthetic heart valve: Secondary | ICD-10-CM | POA: Diagnosis not present

## 2020-09-28 DIAGNOSIS — I2510 Atherosclerotic heart disease of native coronary artery without angina pectoris: Secondary | ICD-10-CM

## 2020-09-28 DIAGNOSIS — E7849 Other hyperlipidemia: Secondary | ICD-10-CM

## 2020-09-28 DIAGNOSIS — I712 Thoracic aortic aneurysm, without rupture: Secondary | ICD-10-CM

## 2020-09-28 DIAGNOSIS — I7121 Aneurysm of the ascending aorta, without rupture: Secondary | ICD-10-CM

## 2020-09-28 NOTE — Patient Instructions (Signed)
Medication Instructions:  No changes *If you need a refill on your cardiac medications before your next appointment, please call your pharmacy*    Follow-Up: At [facility], you and your health needs are our priority.  As part of our continuing mission to provide you with exceptional heart care, we have created designated Provider Care Teams.  These Care Teams include your primary Cardiologist (physician) and Advanced Practice Providers (APPs -  Physician Assistants and Nurse Practitioners) who all work together to provide you with the care you need, when you need it.  We recommend signing up for the patient portal called "MyChart".  Sign up information is provided on this After Visit Summary.  MyChart is used to connect with patients for Virtual Visits (Telemedicine).  Patients are able to view lab/test results, encounter notes, upcoming appointments, etc.  Non-urgent messages can be sent to your provider as well.   To learn more about what you can do with MyChart, go to [email or web address].    Your next appointment:   1 year(s)  The format for your next appointment:   In Person  Provider:   Lauree Chandler, MD or Richardson Dopp, PA-C    Other Instructions

## 2020-11-09 DIAGNOSIS — H524 Presbyopia: Secondary | ICD-10-CM | POA: Diagnosis not present

## 2020-11-09 DIAGNOSIS — H5203 Hypermetropia, bilateral: Secondary | ICD-10-CM | POA: Diagnosis not present

## 2020-11-09 DIAGNOSIS — H18513 Endothelial corneal dystrophy, bilateral: Secondary | ICD-10-CM | POA: Diagnosis not present

## 2020-11-18 DIAGNOSIS — Z7901 Long term (current) use of anticoagulants: Secondary | ICD-10-CM | POA: Diagnosis not present

## 2020-12-09 ENCOUNTER — Ambulatory Visit (INDEPENDENT_AMBULATORY_CARE_PROVIDER_SITE_OTHER): Payer: 59

## 2020-12-09 ENCOUNTER — Ambulatory Visit (INDEPENDENT_AMBULATORY_CARE_PROVIDER_SITE_OTHER): Payer: 59 | Admitting: Sports Medicine

## 2020-12-09 ENCOUNTER — Other Ambulatory Visit: Payer: Self-pay | Admitting: Sports Medicine

## 2020-12-09 ENCOUNTER — Other Ambulatory Visit: Payer: Self-pay

## 2020-12-09 ENCOUNTER — Encounter: Payer: Self-pay | Admitting: Sports Medicine

## 2020-12-09 DIAGNOSIS — M216X1 Other acquired deformities of right foot: Secondary | ICD-10-CM | POA: Diagnosis not present

## 2020-12-09 DIAGNOSIS — M79671 Pain in right foot: Secondary | ICD-10-CM | POA: Diagnosis not present

## 2020-12-09 DIAGNOSIS — M79672 Pain in left foot: Secondary | ICD-10-CM

## 2020-12-09 DIAGNOSIS — R234 Changes in skin texture: Secondary | ICD-10-CM

## 2020-12-09 DIAGNOSIS — M216X2 Other acquired deformities of left foot: Secondary | ICD-10-CM

## 2020-12-09 DIAGNOSIS — M722 Plantar fascial fibromatosis: Secondary | ICD-10-CM

## 2020-12-09 MED ORDER — TRIAMCINOLONE ACETONIDE 10 MG/ML IJ SUSP
10.0000 mg | Freq: Once | INTRAMUSCULAR | Status: AC
  Administered 2020-12-09: 10 mg

## 2020-12-09 NOTE — Progress Notes (Signed)
Subjective: Daniel Hickman is a 60 y.o. male patient presents to office with complaint of moderate heel pain on the right. Patient admits to post static dyskinesia for 7-8 years off and onl but last month has been very painful.  Admits to a previous history of plantar fasciitis treated in the past by Dr. Paulla Dolly with injection and night splint that helped at the time.  Patient also states that he has some dry skin at the back of the left heel that is painful to touch.  Patient also reports that he has an issue with his right first toenail became very brittle and fell off and wants to know why.  Reports that this happened about 6 months ago and denies any injury to the right great toenail. Denies any other pedal complaints.   Review of Systems  All other systems reviewed and are negative.   Patient Active Problem List   Diagnosis Date Noted  . CAD (coronary artery disease) 02/13/2019  . Bicuspid aortic valve 09/10/2013  . Ejection fraction   . Dilated aortic root ([place])   . Migraine   . Mitral valve prolapse   . GI bleed   . Aortic insufficiency   . S/P AVR (aortic valve replacement)   . Drug therapy     Current Outpatient Medications on File Prior to Visit  Medication Sig Dispense Refill  . amoxicillin (AMOXIL) 500 MG capsule Take 500 mg by mouth as needed (for Dental procedures).     Marland Kitchen aspirin 81 MG tablet Take 81 mg by mouth every other day.    . nitroGLYCERIN (NITROSTAT) 0.4 MG SL tablet Place 1 tablet (0.4 mg total) under the tongue every 5 (five) minutes as needed for chest pain. 25 tablet 11  . rosuvastatin (CRESTOR) 10 MG tablet Take 10 mg by mouth daily.    Marland Kitchen warfarin (COUMADIN) 4 MG tablet Take 4 mg by mouth as directed. 1 tablet by mouth once a day Monday-Saturday and Sunday take 6 mg by mouth  3   No current facility-administered medications on file prior to visit.    No Known Allergies  Objective: Physical Exam General: The patient is alert and oriented x3 in no acute  distress.  Dermatology: Skin is warm, dry and supple bilateral lower extremities. Nails 1-10 are normal except right great toenail where there is multiple fragments and thats brittle  with no signs of infection. There is no erythema, edema, no eccymosis, no open lesions present.  Dry skin with fissuring noted to the left heel without any signs of infection.  Integument is otherwise unremarkable.  Vascular: Dorsalis Pedis pulse and Posterior Tibial pulse are 2/4 bilateral. Capillary fill time is immediate to all digits.  Neurological: Grossly intact to light touch bilateral.  Musculoskeletal: Tenderness to palpation at the medial calcaneal tubercale and through the insertion of the plantar fascia on the /right foot. No pain with compression of calcaneus bilateral. No pain with tuning fork to calcaneus bilateral. No pain with calf compression bilateral. There is decreased Ankle joint range of motion bilateral. All other joints range of motion within normal limits bilateral. Strength 5/5 in all groups bilateral.   Gait: Unassisted, Antalgic avoid weight on Right heel  Xray, Right/Left foot:  Normal osseous mineralization. Joint spaces preserved. No fracture/dislocation/boney destruction. Calcaneal spur present with mild thickening of plantar fascia. No other soft tissue abnormalities or radiopaque foreign bodies.   Assessment and Plan: Problem List Items Addressed This Visit   None   Visit Diagnoses  Pain in right foot    -  Primary   Pain in left foot       Plantar fasciitis, right       Fissure in skin of foot       left heel   Acquired equinus deformity of both feet          -Complete examination performed.  -Xrays reviewed -Discussed with patient in detail the condition of plantar fasciitis, how this occurs and general treatment options. Explained both conservative and surgical treatments.  -After oral consent and aseptic prep, injected a mixture containing 1 ml of 2%  plain  lidocaine, 1 ml 0.5% plain marcaine, 0.5 ml of kenalog 10 and 0.5 ml of dexamethasone phosphate into Right heel. Post-injection care discussed with patient.  -Recommended good supportive shoes and advised use of heel lifts as provided at this visit and advised patient to refrain from walking barefoot even in the house -Explained in detail the use of the night splint for the right which was dispensed at today's visit. -Explained and dispensed to patient daily stretching exercises. -Recommend patient to ice affected area 1-2x daily. -Gave sample of foot miracle cream for the left heel -Patient to return to office in 4 weeks for follow up or sooner if problems or questions arise.  Landis Martins, DPM

## 2020-12-18 ENCOUNTER — Other Ambulatory Visit (HOSPITAL_COMMUNITY): Payer: Self-pay

## 2021-01-07 ENCOUNTER — Other Ambulatory Visit (HOSPITAL_COMMUNITY): Payer: Self-pay

## 2021-01-07 MED ORDER — TIZANIDINE HCL 4 MG PO TABS
4.0000 mg | ORAL_TABLET | Freq: Three times a day (TID) | ORAL | 1 refills | Status: AC | PRN
  Filled 2021-01-07: qty 30, 10d supply, fill #0

## 2021-01-10 ENCOUNTER — Other Ambulatory Visit (HOSPITAL_COMMUNITY): Payer: Self-pay

## 2021-01-10 DIAGNOSIS — M5459 Other low back pain: Secondary | ICD-10-CM | POA: Diagnosis not present

## 2021-01-10 MED ORDER — METHOCARBAMOL 500 MG PO TABS
500.0000 mg | ORAL_TABLET | Freq: Two times a day (BID) | ORAL | 0 refills | Status: DC
  Filled 2021-01-10: qty 14, 7d supply, fill #0

## 2021-01-10 MED ORDER — PREDNISONE 5 MG PO TABS
5.0000 mg | ORAL_TABLET | ORAL | 0 refills | Status: DC
  Filled 2021-01-10: qty 21, 6d supply, fill #0

## 2021-02-16 ENCOUNTER — Other Ambulatory Visit (HOSPITAL_COMMUNITY): Payer: Self-pay

## 2021-02-16 MED ORDER — AMOXICILLIN 500 MG PO CAPS
2000.0000 mg | ORAL_CAPSULE | ORAL | 0 refills | Status: AC
  Filled 2021-02-16: qty 12, 4d supply, fill #0

## 2021-02-24 ENCOUNTER — Other Ambulatory Visit (HOSPITAL_COMMUNITY): Payer: Self-pay

## 2021-02-25 ENCOUNTER — Other Ambulatory Visit (HOSPITAL_COMMUNITY): Payer: Self-pay

## 2021-02-25 ENCOUNTER — Other Ambulatory Visit: Payer: Self-pay | Admitting: Cardiovascular Disease

## 2021-02-25 NOTE — Telephone Encounter (Signed)
Pt does not have Warfarin managed at anticoagulation clinic.  Unsure who manages pt's Warfarin.  Attempted to call pt, LMOM TCB.  Will need to notify pharmacy of managing MD to forward Warfarin refill to.   Called spoke with [facility] pharmacy staff, advised we do not manage or monitor Warfarin for this pt.  Last note on 08/31/20 states Dr Daniel Hickman manages Warfarin, they are going to forward refill request to their office.  They will notify pt if he calls back or comes in looking for refill.  I have left a message at both pt's home number and cell number TCB.

## 2021-02-26 IMAGING — CT CT HEAR MORPH WITH CTA COR WITH SCORE WITH CA WITH CONTRAST AND
4 of 7 series · 8 of 20 positions shown, 9 images · non-contrast
Comparison: 11/17/2004
COMPARISON: 11/17/2004

Addendum:
EXAM:
OVER-READ INTERPRETATION  CT CHEST

The following report is an over-read performed by radiologist Dr.
Jeppe Juel Nickelsen [REDACTED] on 02/12/2019. This over-read
does not include interpretation of cardiac or coronary anatomy or
pathology. The coronary CTA interpretation by the cardiologist is
attached.
HISTORY: exertional chest pain, bicuspid aortic valve with severe AI with
mechanical aortic valve replacement in November 2004.
Cardiac/Coronary  CT
TECHNIQUE: The patient was scanned on a Siemens Force scanner.
PROTOCOL: A 120 kV prospective scan was triggered in the descending thoracic
aorta at 111 HU's. Axial non-contrast 3 mm slices were carried out
through the heart. The data set was analyzed on a dedicated work
station and scored using the Agatson method. Gantry rotation speed
was 250 msecs and collimation was .6 mm. Beta blockade and 0.8 mg of
sl NTG was given. The 3D data set was reconstructed in 5% intervals
of the 67-82 % of the R-R cycle. Diastolic phases were analyzed on a
dedicated work station using MPR, MIP and VRT modes.

[Series 6: best diast 72 % · axial · 0.31mm/px · z∈[+1172,+1227]mm · 2 of 408 slices shown, 3 images]
[im 136/408  vessel]
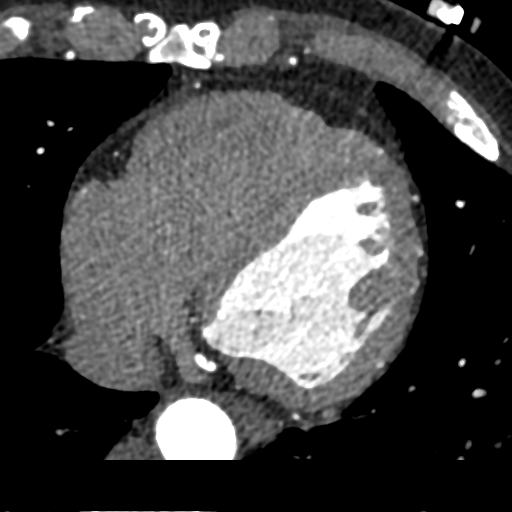
[im 136/408  lung]
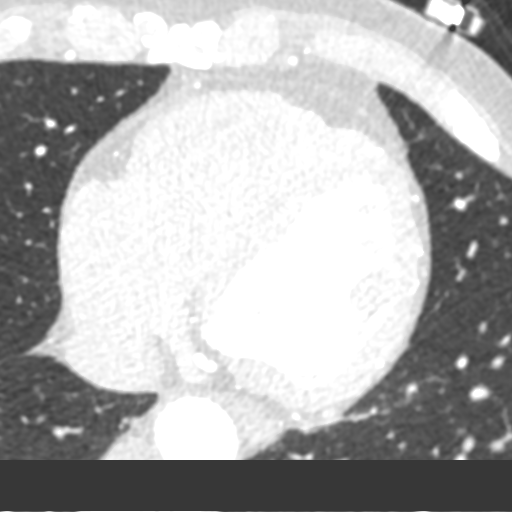
[im 272/408  vessel]
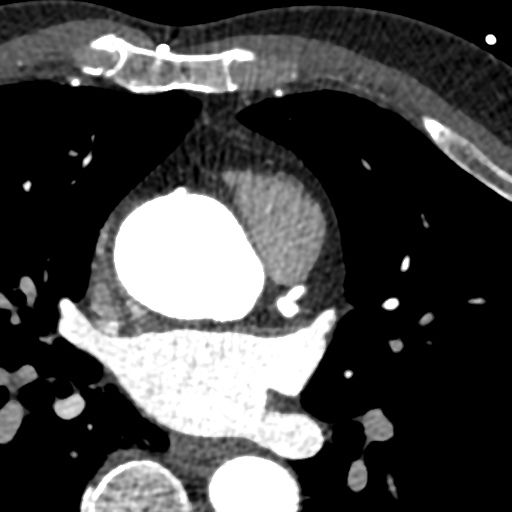

[Series 7: best syst 29 % · axial · 0.31mm/px · z∈[+1172,+1227]mm · 2 of 408 slices shown]
[im 136/408  vessel]
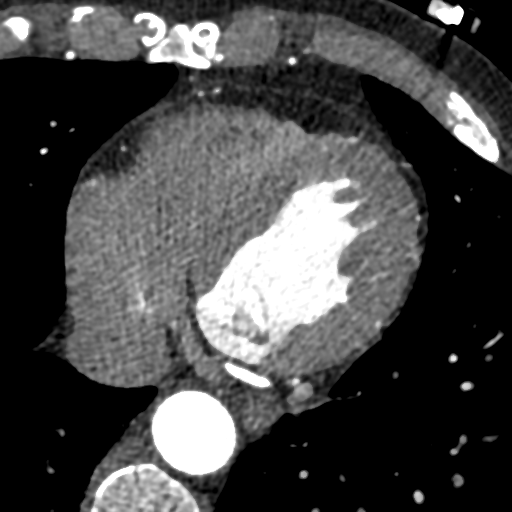
[im 272/408  vessel]
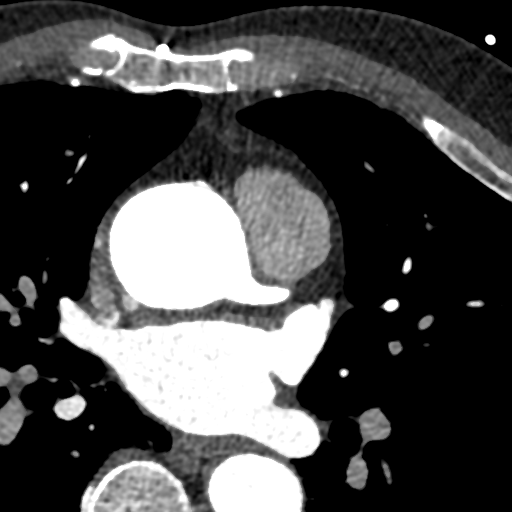

[Series 8: ts diast sharp 72 % · axial · 0.31mm/px · z∈[+1172,+1227]mm · 2 of 408 slices shown]
[im 136/408  lung]
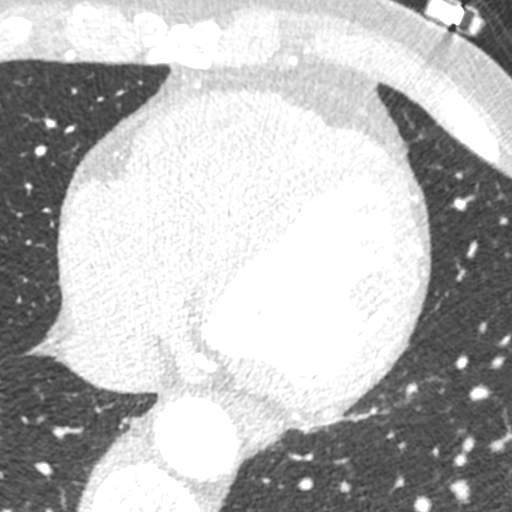
[im 272/408  lung]
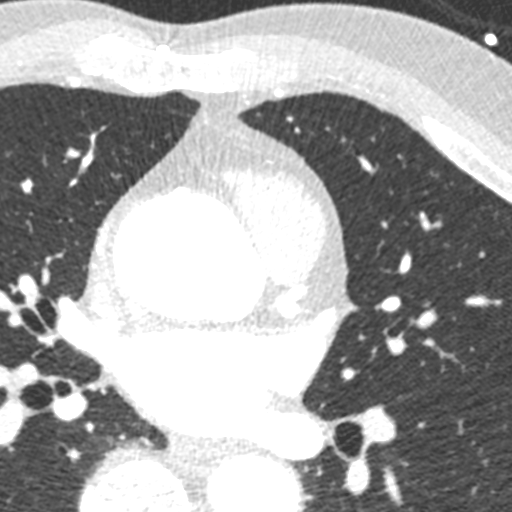

[Series 9: ts syst sharp 29 % · axial · 0.31mm/px · z∈[+1172,+1227]mm · 2 of 408 slices shown]
[im 136/408  lung]
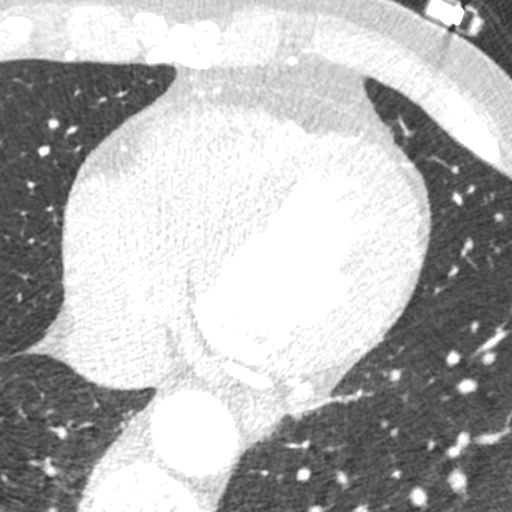
[im 272/408  lung]
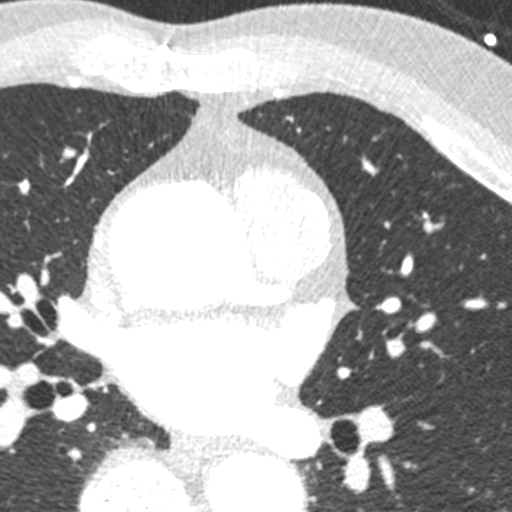

[8 of 20 positions shown; findings below may reference images not displayed]

FINDINGS: Vascular: Mild aneurysmal dilatation of the aortic root which
measures 4.3 cm at the sinuses of Valsalva. Similar size aortic root
measurement is noted on prior report from 3779.

Mediastinum/Nodes: No adenopathy in the lower mediastinum or hila.

Lungs/Pleura: Visualized lungs clear.  No effusions.

Upper Abdomen: Imaging into the upper abdomen shows no acute
findings.

Musculoskeletal: Chest wall soft tissues are unremarkable. No acute
bony abnormality.
IMPRESSION: Aneurysmal dilatation of the aortic root, 4.3 cm maximally. This is
stable since 3779. Recommend annual imaging followup by CTA or MRA.
This recommendation follows 9636
ACCF/AHA/AATS/ACR/ASA/SCA/TIGER/MTAMBALA/HAASTRUP/PIPI Guidelines for the
Diagnosis and Management of Patients with Thoracic Aortic Disease.
Circulation. 9636; 121: E266-e369. Aortic aneurysm NOS (E7G7R-3XD.9)
FINDINGS: Coronary calcium score: The patient's coronary artery calcium score
is 13, which places the patient in the 49th percentile. This is
average for what is expected for age and sex matched peers. Calcium
noted in Mid LAD.

Coronary arteries: Normal coronary origins.  Left dominance.

Right Coronary Artery: Small caliber, nondominant right coronary
artery with no significant plaque or stenosis.

Left Main Coronary Artery: No detectable plaque or stenosis.

Left Anterior Descending Coronary Artery: Mixed atherosclerotic
plaque in the mid LAD prior to the takeoff a the large second
diagonal branch, with minimal stenosis (<25% stenosis), possible
positive remodeling. In the proximal large second diagonal there is
an atherosclerotic plaque with mild stenosis, 25-49% stenosis.

Left Circumflex Artery: Large, dominant circumflex artery, with no
detectable plaque or stenosis. Patent PDA and PL branches.

Aorta: Dilated sinuses of valsalva, R-L: 40 mm L-Non: 44 mm R-Non:
44 mm.

37 mm at the mid ascending aorta (level of the PA bifurcation)
measured double oblique. No calcifications. No dissection.

Aortic Valve: Mechanical aortic valve (27 mm [place] per EMR, December 05, 2004 Dr. Suriansyah). Leaflet motion cannot be assess with portion
of cardiac cycle captured. No findings to suggest thrombus, pannus,
or perivalvular abnormalities.

Other findings:

Normal pulmonary vein drainage into the left atrium.

Normal left atrial appendage without a thrombus.

Normal size of the pulmonary artery.
IMPRESSION: 1. Mild CAD in proximal second diagonal branch, CADRADS = 2.

2. The patient's coronary artery calcium score is 13, which places
the patient in the 49th percentile. This is average for what is
expected for age and sex matched peers.

3. Normal coronary origin with left dominance.

4. Dilated sinuses of Valsalva, R-L: 40 mm L-Non: 44 mm R-Non: 44
mm. As compared to the report from 3779, this may not have
significantly changed. Side by side comparison of images not able to
be performed. Normal caliber mid ascending aorta, 37 mm.

*** End of Addendum ***
EXAM:
OVER-READ INTERPRETATION  CT CHEST

The following report is an over-read performed by radiologist Dr.
Jeppe Juel Nickelsen [REDACTED] on 02/12/2019. This over-read
does not include interpretation of cardiac or coronary anatomy or
pathology. The coronary CTA interpretation by the cardiologist is
attached.
FINDINGS: Vascular: Mild aneurysmal dilatation of the aortic root which
measures 4.3 cm at the sinuses of Valsalva. Similar size aortic root
measurement is noted on prior report from 3779.

Mediastinum/Nodes: No adenopathy in the lower mediastinum or hila.

Lungs/Pleura: Visualized lungs clear.  No effusions.

Upper Abdomen: Imaging into the upper abdomen shows no acute
findings.

Musculoskeletal: Chest wall soft tissues are unremarkable. No acute
bony abnormality.
IMPRESSION: Aneurysmal dilatation of the aortic root, 4.3 cm maximally. This is
stable since 3779. Recommend annual imaging followup by CTA or MRA.
This recommendation follows 9636
ACCF/AHA/AATS/ACR/ASA/SCA/TIGER/MTAMBALA/HAASTRUP/PIPI Guidelines for the
Diagnosis and Management of Patients with Thoracic Aortic Disease.
Circulation. 9636; 121: [id number]. Aortic aneurysm NOS (E7G7R-3XD.9)

## 2021-02-28 ENCOUNTER — Other Ambulatory Visit (HOSPITAL_COMMUNITY): Payer: Self-pay

## 2021-02-28 MED ORDER — WARFARIN SODIUM 4 MG PO TABS
4.0000 mg | ORAL_TABLET | Freq: Every day | ORAL | 3 refills | Status: DC
  Filled 2021-02-28: qty 100, 90d supply, fill #0
  Filled 2021-05-23: qty 100, 90d supply, fill #1

## 2021-03-15 ENCOUNTER — Other Ambulatory Visit (HOSPITAL_COMMUNITY): Payer: Self-pay

## 2021-03-15 MED FILL — Rosuvastatin Calcium Tab 10 MG: ORAL | 90 days supply | Qty: 90 | Fill #0 | Status: AC

## 2021-05-10 DIAGNOSIS — Z7901 Long term (current) use of anticoagulants: Secondary | ICD-10-CM | POA: Diagnosis not present

## 2021-05-24 ENCOUNTER — Other Ambulatory Visit (HOSPITAL_COMMUNITY): Payer: Self-pay

## 2021-05-25 ENCOUNTER — Other Ambulatory Visit (HOSPITAL_COMMUNITY): Payer: Self-pay

## 2021-06-20 ENCOUNTER — Other Ambulatory Visit (HOSPITAL_COMMUNITY): Payer: Self-pay

## 2021-06-20 DIAGNOSIS — L308 Other specified dermatitis: Secondary | ICD-10-CM | POA: Diagnosis not present

## 2021-06-20 MED ORDER — MUPIROCIN 2 % EX OINT
1.0000 "application " | TOPICAL_OINTMENT | Freq: Every day | CUTANEOUS | 2 refills | Status: DC
  Filled 2021-06-20: qty 22, 22d supply, fill #0

## 2021-06-20 MED ORDER — BETAMETHASONE DIPROPIONATE AUG 0.05 % EX OINT
1.0000 "application " | TOPICAL_OINTMENT | Freq: Two times a day (BID) | CUTANEOUS | 2 refills | Status: DC
  Filled 2021-06-20: qty 50, 25d supply, fill #0

## 2021-07-01 ENCOUNTER — Other Ambulatory Visit (HOSPITAL_COMMUNITY): Payer: Self-pay

## 2021-07-01 DIAGNOSIS — L259 Unspecified contact dermatitis, unspecified cause: Secondary | ICD-10-CM | POA: Diagnosis not present

## 2021-07-01 MED ORDER — PREDNISONE 20 MG PO TABS
ORAL_TABLET | ORAL | 0 refills | Status: AC
  Filled 2021-07-01: qty 10, 7d supply, fill #0

## 2021-08-13 ENCOUNTER — Other Ambulatory Visit (HOSPITAL_COMMUNITY): Payer: Self-pay

## 2021-08-15 ENCOUNTER — Other Ambulatory Visit (HOSPITAL_COMMUNITY): Payer: Self-pay

## 2021-08-15 MED ORDER — WARFARIN SODIUM 4 MG PO TABS
ORAL_TABLET | ORAL | 3 refills | Status: DC
  Filled 2021-08-15: qty 100, 87d supply, fill #0
  Filled 2021-11-02: qty 100, 87d supply, fill #1
  Filled 2022-02-07: qty 100, 87d supply, fill #2
  Filled 2022-05-02: qty 100, 87d supply, fill #3

## 2021-08-17 ENCOUNTER — Ambulatory Visit: Payer: 59

## 2021-08-17 ENCOUNTER — Other Ambulatory Visit: Payer: Self-pay

## 2021-08-17 ENCOUNTER — Ambulatory Visit (INDEPENDENT_AMBULATORY_CARE_PROVIDER_SITE_OTHER): Payer: 59

## 2021-08-17 ENCOUNTER — Encounter: Payer: Self-pay | Admitting: Podiatry

## 2021-08-17 ENCOUNTER — Ambulatory Visit: Payer: 59 | Admitting: Podiatry

## 2021-08-17 DIAGNOSIS — M722 Plantar fascial fibromatosis: Secondary | ICD-10-CM

## 2021-08-17 DIAGNOSIS — M216X1 Other acquired deformities of right foot: Secondary | ICD-10-CM

## 2021-08-17 DIAGNOSIS — M79671 Pain in right foot: Secondary | ICD-10-CM

## 2021-08-17 MED ORDER — TRIAMCINOLONE ACETONIDE 10 MG/ML IJ SUSP
10.0000 mg | Freq: Once | INTRAMUSCULAR | Status: AC
Start: 2021-08-17 — End: 2021-08-17
  Administered 2021-08-17: 10 mg

## 2021-08-17 NOTE — Progress Notes (Signed)
SITUATION Reason for Consult: Evaluation for Bilateral Custom Foot Orthoses Patient / Caregiver Report: Patient is ready to do custom foot orthotics  OBJECTIVE DATA: Patient History / Diagnosis: Acquired equinus deformity of both feet  Current or Previous Devices: Goodfeet prefabs  Foot Examination: Skin presentation:   Intact Ulcers & Call[place]:   None and no history Toe / Foot Deformities:  Pes cavus Weight Bearing Presentation:  cavus Sensation:    Intact  ORTHOTIC RECOMMENDATION Recommended Device: 1x pair of custom functional foot orthotics  GOALS OF ORTHOSES - Reduce Pain - Prevent Foot Deformity - Prevent Progression of Further Foot Deformity - Relieve Pressure - Improve the Overall Biomechanical Function of the Foot and Lower Extremity.  ACTIONS PERFORMED Patient was casted for Foot Orthoses via crush box. Procedure was explained and patient tolerated procedure well. All questions were answered and concerns addressed.  PLAN Potential out of pocket cost was communicated to patient. Casts are to be sent to [facility] for fabrication. Patient is to be called for fitting when devices are ready.

## 2021-08-17 NOTE — Progress Notes (Signed)
Subjective:   Patient ID: Daniel Hickman, male   DOB: 60 y.o.   MRN: [id number]   HPI Patient presents stating that he is still having pain in his heel and only had several months of relief at last visit and states it is very sore on the bottom of the heel and is making walking difficult   ROS      Objective:  Physical Exam  Neurovascular status intact exquisite discomfort plantar aspect right heel at the insertional point tendon calcaneus with inflammation     Assessment:  Acute plantar fasciitis right with inflammation     Plan:  Reviewed condition and went ahead today did sterile prep and reinjected the plantar fascia 3 mg Kenalog 5 mg Xylocaine and instructed on night splint usage and at this point went ahead and casted for functional orthotic devices.  Discussed orthotics usage and foot structure and he has had them in the past and he did do well with

## 2021-08-31 ENCOUNTER — Other Ambulatory Visit: Payer: Self-pay | Admitting: Cardiovascular Disease

## 2021-09-01 ENCOUNTER — Other Ambulatory Visit (HOSPITAL_COMMUNITY): Payer: Self-pay

## 2021-09-01 MED ORDER — ROSUVASTATIN CALCIUM 10 MG PO TABS
10.0000 mg | ORAL_TABLET | Freq: Every day | ORAL | 0 refills | Status: DC
  Filled 2021-09-01: qty 15, 15d supply, fill #0

## 2021-09-07 ENCOUNTER — Other Ambulatory Visit (HOSPITAL_COMMUNITY): Payer: Self-pay

## 2021-09-07 DIAGNOSIS — L308 Other specified dermatitis: Secondary | ICD-10-CM | POA: Diagnosis not present

## 2021-09-07 DIAGNOSIS — L0889 Other specified local infections of the skin and subcutaneous tissue: Secondary | ICD-10-CM | POA: Diagnosis not present

## 2021-09-07 DIAGNOSIS — L011 Impetiginization of other dermatoses: Secondary | ICD-10-CM | POA: Diagnosis not present

## 2021-09-07 MED ORDER — CEPHALEXIN 500 MG PO CAPS
500.0000 mg | ORAL_CAPSULE | Freq: Three times a day (TID) | ORAL | 0 refills | Status: AC
  Filled 2021-09-07: qty 30, 10d supply, fill #0

## 2021-09-21 ENCOUNTER — Telehealth: Payer: Self-pay | Admitting: Podiatry

## 2021-09-21 NOTE — Telephone Encounter (Signed)
Orthotics in.. lvm for pt to call to schedule an appt to pick them up. °

## 2021-09-28 ENCOUNTER — Telehealth: Payer: Self-pay | Admitting: Cardiovascular Disease

## 2021-09-28 ENCOUNTER — Ambulatory Visit (INDEPENDENT_AMBULATORY_CARE_PROVIDER_SITE_OTHER): Payer: 59

## 2021-09-28 ENCOUNTER — Other Ambulatory Visit: Payer: Self-pay

## 2021-09-28 ENCOUNTER — Other Ambulatory Visit (HOSPITAL_COMMUNITY): Payer: Self-pay

## 2021-09-28 DIAGNOSIS — M216X1 Other acquired deformities of right foot: Secondary | ICD-10-CM

## 2021-09-28 MED ORDER — ROSUVASTATIN CALCIUM 10 MG PO TABS
10.0000 mg | ORAL_TABLET | Freq: Every day | ORAL | 1 refills | Status: DC
  Filled 2021-09-28: qty 90, 90d supply, fill #0

## 2021-09-28 NOTE — Progress Notes (Signed)
SITUATION: Reason for Visit: Fitting and Delivery of Custom Fabricated Foot Orthoses Patient Report: Patient reports comfort and is satisfied with device.  OBJECTIVE DATA: Patient History / Diagnosis:     ICD-10-CM   1. Acquired equinus deformity of both feet  M21.6X1    M21.6X2       Provided Device:  Custom functional foot orthotics  GOAL OF ORTHOSIS - Improve gait - Decrease energy expenditure - Improve Balance - Provide Triplanar stability of foot complex - Facilitate motion  ACTIONS PERFORMED Patient was fit with foot orthotics trimmed to shoe last. Patient tolerated fittign procedure. Device was modified as follows to better fit patient: - Toe plate was trimmed to shoe last  Patient was provided with verbal and written instruction and demonstration regarding donning, doffing, wear, care, proper fit, function, purpose, cleaning, and use of the orthosis and in all related precautions and risks and benefits regarding the orthosis.  Patient was also provided with verbal instruction regarding how to report any failures or malfunctions of the orthosis and necessary follow up care. Patient was also instructed to contact our office regarding any change in status that may affect the function of the orthosis.  Patient demonstrated independence with proper donning, doffing, and fit and verbalized understanding of all instructions.  PLAN: Patient is to follow up in one week or as necessary (PRN). All questions were answered and concerns addressed. Plan of care was discussed with and agreed upon by the patient.

## 2021-09-28 NOTE — Telephone Encounter (Signed)
Pt's medication was sent to pt's pharmacy as requested. Confirmation received.  °

## 2021-09-28 NOTE — Telephone Encounter (Signed)
°*  STAT* If patient is at the pharmacy, call can be transferred to refill team.   1. Which medications need to be refilled? (please list name of each medication and dose if known)  rosuvastatin (CRESTOR) 10 MG tablet  2. Which pharmacy/location (including street and city if local pharmacy) is medication to be sent to? [facility]  3. Do they need a 30 day or 90 day supply? 90 with refills  Patient is scheduled to see Dr. Angelena Form 01/20/22

## 2021-09-29 ENCOUNTER — Other Ambulatory Visit (HOSPITAL_COMMUNITY): Payer: Self-pay

## 2021-09-29 DIAGNOSIS — L308 Other specified dermatitis: Secondary | ICD-10-CM | POA: Diagnosis not present

## 2021-09-29 MED ORDER — PREDNISONE 20 MG PO TABS
ORAL_TABLET | ORAL | 0 refills | Status: AC
  Filled 2021-09-29: qty 21, 14d supply, fill #0

## 2021-09-29 MED ORDER — DOXYCYCLINE MONOHYDRATE 100 MG PO TABS
100.0000 mg | ORAL_TABLET | Freq: Two times a day (BID) | ORAL | 0 refills | Status: AC
  Filled 2021-09-29: qty 20, 10d supply, fill #0

## 2021-10-24 DIAGNOSIS — Z7901 Long term (current) use of anticoagulants: Secondary | ICD-10-CM | POA: Diagnosis not present

## 2021-10-25 DIAGNOSIS — L308 Other specified dermatitis: Secondary | ICD-10-CM | POA: Diagnosis not present

## 2021-11-03 ENCOUNTER — Other Ambulatory Visit (HOSPITAL_COMMUNITY): Payer: Self-pay

## 2022-01-10 ENCOUNTER — Other Ambulatory Visit (HOSPITAL_COMMUNITY): Payer: Self-pay

## 2022-01-10 MED ORDER — CETIRIZINE HCL 10 MG PO TABS
10.0000 mg | ORAL_TABLET | Freq: Every evening | ORAL | 0 refills | Status: AC
Start: 2022-01-10 — End: ?
  Filled 2022-01-10: qty 30, 30d supply, fill #0

## 2022-01-10 MED ORDER — TRIAMCINOLONE ACETONIDE 0.1 % EX CREA
1.0000 | TOPICAL_CREAM | Freq: Four times a day (QID) | CUTANEOUS | 0 refills | Status: AC
Start: 2022-01-10 — End: ?
  Filled 2022-01-10: qty 15, 4d supply, fill #0

## 2022-01-12 DIAGNOSIS — Z7901 Long term (current) use of anticoagulants: Secondary | ICD-10-CM | POA: Diagnosis not present

## 2022-01-17 ENCOUNTER — Other Ambulatory Visit (HOSPITAL_COMMUNITY): Payer: Self-pay

## 2022-01-17 MED ORDER — COVID-19 AT HOME ANTIGEN TEST VI KIT
PACK | 0 refills | Status: DC
  Filled 2022-01-17: qty 4, 4d supply, fill #0

## 2022-01-20 ENCOUNTER — Ambulatory Visit: Payer: 59 | Admitting: Cardiovascular Disease

## 2022-01-20 ENCOUNTER — Other Ambulatory Visit (HOSPITAL_COMMUNITY): Payer: Self-pay

## 2022-01-20 ENCOUNTER — Encounter: Payer: Self-pay | Admitting: Cardiovascular Disease

## 2022-01-20 VITALS — BP 118/80 | HR 65 | Ht 72.0 in | Wt 176.2 lb

## 2022-01-20 DIAGNOSIS — E7800 Pure hypercholesterolemia, unspecified: Secondary | ICD-10-CM | POA: Diagnosis not present

## 2022-01-20 DIAGNOSIS — I7121 Aneurysm of the ascending aorta, without rupture: Secondary | ICD-10-CM | POA: Diagnosis not present

## 2022-01-20 DIAGNOSIS — I2510 Atherosclerotic heart disease of native coronary artery without angina pectoris: Secondary | ICD-10-CM

## 2022-01-20 DIAGNOSIS — Z952 Presence of prosthetic heart valve: Secondary | ICD-10-CM

## 2022-01-20 LAB — HEPATIC FUNCTION PANEL
ALT: 17 IU/L (ref 0–44)
AST: 23 IU/L (ref 0–40)
Albumin: 4.6 g/dL (ref 3.8–4.9)
Alkaline Phosphatase: 78 IU/L (ref 44–121)
Bilirubin Total: 1.3 mg/dL — ABNORMAL HIGH (ref 0.0–1.2)
Bilirubin, Direct: 0.28 mg/dL (ref 0.00–0.40)
Total Protein: 6.9 g/dL (ref 6.0–8.5)

## 2022-01-20 LAB — LIPID PANEL
Chol/HDL Ratio: 3.3 ratio (ref 0.0–5.0)
Cholesterol, Total: 174 mg/dL (ref 100–199)
HDL: 53 mg/dL (ref >39)
LDL Chol Calc (NIH): 107 mg/dL — ABNORMAL HIGH (ref 0–99)
Triglycerides: 75 mg/dL (ref 0–149)
VLDL Cholesterol Cal: 14 mg/dL (ref 5–40)

## 2022-01-20 MED ORDER — ROSUVASTATIN CALCIUM 10 MG PO TABS
10.0000 mg | ORAL_TABLET | Freq: Every day | ORAL | 3 refills | Status: DC
  Filled 2022-01-20: qty 90, 90d supply, fill #0
  Filled 2022-05-02: qty 90, 90d supply, fill #1
  Filled 2022-11-13: qty 90, 90d supply, fill #2

## 2022-01-20 NOTE — Patient Instructions (Signed)
Medication Instructions:  ?No changes today ?*If you need a refill on your cardiac medications before your next appointment, please call your pharmacy* ? ? ?Lab Work: ?Today: lipids/liver function ? ?If you have labs (blood work) drawn today and your tests are completely normal, you will receive your results only by: ?MyChart Message (if you have MyChart) OR ?A paper copy in the mail ?If you have any lab test that is abnormal or we need to change your treatment, we will call you to review the results. ? ? ?Testing/Procedures: ?Chest CTA - aorta - DUE December 2023 ? ? ?Follow-Up: ?At [facility], you and your health needs are our priority.  As part of our continuing mission to provide you with exceptional heart care, we have created designated Provider Care Teams.  These Care Teams include your primary Cardiologist (physician) and Advanced Practice Providers (APPs -  Physician Assistants and Nurse Practitioners) who all work together to provide you with the care you need, when you need it. ? ?Your next appointment:   ?12 month(s) ? ?The format for your next appointment:   ?In Person ? ?Provider:   ?Lauree Chandler, MD   ? ? ?Important Information About Sugar ? ? ? ? ?  ?

## 2022-01-20 NOTE — Progress Notes (Signed)
? ? ?Chief Complaint  ?Patient presents with  ? Follow-up  ?  CAD  ? ?History of Present Illness: 61 yo male with history of CAD, thoracic aortic aneurysm and aortic valve disease s/p AVR with St. Jude mechanical valve in 2006 on chronic coumadin therapy here today for follow up. His coumadin is followed by Dr. Terrence Dupont. He tells me that he does not discuss cardiac issues with Dr. Terrence Dupont but he manages his INR and writes for his coumadin.  Coronary CTA in May 2020 with mild disease in the LAD and Diagonal. Mild dilation of the ascending aorta. Chest CTA December 2021 with 3.9 cm ascending aorta. Echo May 2020 with LVEF=50-55%. AVR working well. He was last seen in our office in January 2022.  ? ?He is here today for follow up. The patient denies any chest pain, dyspnea, palpitations, lower extremity edema, orthopnea, PND, dizziness, near syncope or syncope.  ? ?Primary Care Physician: Chesley Noon, MD ? ?Past Medical History:  ?Diagnosis Date  ? Anemia   ? Anxiety   ? Aortic insufficiency   ? EF 55%    , AVR 2006  ? CAD (coronary artery disease) 02/13/2019  ? Cor CTA 01/2019: LAD mid <25; D2 prox 25-49; Sinus of Valsalva dilated (40; 44 R+L non cor cusp); Ao root 43 mm  ? Dilated aortic root ([place])   ? Slight dilatation of aortic root by echo, 2006  (CT scan at that time showed that the ascending aorta was less than 40 mm. CT scan at that time also showed no evidence coarct the descending aorta )   //   Aortic root  44 mm, echo, April, 2012 // Cor CTA 01/2019: LAD mid <25; D2 prox 25-49; Sinus of Valsalva dilated (40; 44 R+L non cor cusp); Ao root 43 mm // Chest CT 12/21: ascending aorta 39 mm   ? Drug therapy   ? Coumadin for mechanical aortic valve prosthesis  ? Ejection fraction   ? GI bleed   ? March, 2008, normal endoscopy and colonoscopy, question of small bowel ulcers by capsule endoscopy  ? Migraine   ? Mitral valve prolapse   ? S/P AVR (aortic valve replacement)   ?  Dr. Servando Snare /      March, 2006, 27  mm St. Jude mechanical valve // Echo 01/2019: EF 50-55, Gr 1 DD, dilated asc Aorta 41 mm, mechanical aortic valve normally functioning (peak/mean 20/9), mild AI  ? ? ?Past Surgical History:  ?Procedure Laterality Date  ? AORTIC VALVE REPLACEMENT  12/05/04  ? ? ?Current Outpatient Medications  ?Medication Sig Dispense Refill  ? amoxicillin (AMOXIL) 500 MG capsule Take 4 capsules (2,000 mg total) by mouth as directed 1 hour before dental appointment 12 capsule 0  ? aspirin 81 MG tablet Take 81 mg by mouth every other day.    ? augmented betamethasone dipropionate (DIPROLENE-AF) 0.05 % ointment Apply 1 application topically to the affected area 2 (two) times daily for 1-2 weeks as needed for inflammation. Mix with Mupirocin. 50 g 2  ? cephALEXin (KEFLEX) 500 MG capsule Take 1 capsule (500 mg total) by mouth 3 (three) times daily with meals. 30 capsule 0  ? cetirizine (ZYRTEC) 10 MG tablet Take 1 tablet (10 mg total) by mouth at bedtime. 30 tablet 0  ? COVID-19 At Home Antigen Test [facility] COVID-19 HOME TEST) KIT Use as directed 4 each 0  ? doxycycline (ADOXA) 100 MG tablet Take 1 tablet twice a day 20 tablet  0  ? methocarbamol (ROBAXIN) 500 MG tablet Take 1 tablet (500 mg total) by mouth 2 (two) times daily. 14 tablet 0  ? mupirocin ointment (BACTROBAN) 2 % Apply 1 application topically to the affected area daily. Mix with Bethamethasone. 22 g 2  ? nitroGLYCERIN (NITROSTAT) 0.4 MG SL tablet Place 1 tablet (0.4 mg total) under the tongue every 5 (five) minutes as needed for chest pain. 25 tablet 11  ? predniSONE (DELTASONE) 5 MG tablet Take 6 tablets by mouth on day 1, 5 tablets on day 2, 4 tablets on day 3, 3 tablets on day 4, 2 tablets on day 5 and 1 tablet on day 6 then stop 21 tablet 0  ? tiZANidine (ZANAFLEX) 4 MG tablet Take 1 tablet (4 mg total) by mouth every 8 (eight) hours as needed. 30 tablet 1  ? triamcinolone cream (KENALOG) 0.1 % Apply 1 application topically to affected area 4 (four) times daily. 15 g 0   ? warfarin (COUMADIN) 4 MG tablet TAKE 1 TABLET BY MOUTH ONCE A DAY 5 DAYS/WEEK, THEN 1&1/2 TABLETS ON WEDNESDAY AND SUNDAY 100 tablet 3  ? rosuvastatin (CRESTOR) 10 MG tablet Take 1 tablet (10 mg total) by mouth daily. 90 tablet 3  ? warfarin (COUMADIN) 4 MG tablet TAKE 1 TABLET BY MOUTH ONCE A DAY 5 DAYS/WEEK, THEN 1&1/2 TABLETS ON WEDNESDAY AND SUNDAY 100 tablet 2  ? ?No current facility-administered medications for this visit.  ? ? ?No Known Allergies ? ?Social History  ? ?Socioeconomic History  ? Marital status: Married  ?  Spouse name: Not on file  ? Number of children: Not on file  ? Years of education: Not on file  ? Highest education level: Not on file  ?Occupational History  ? Occupation: EKG tech  ?  Employer: [employer]  ?Tobacco Use  ? Smoking status: Never  ? Smokeless tobacco: Never  ?Vaping Use  ? Vaping Use: Never used  ?Substance and Sexual Activity  ? Alcohol use: No  ? Drug use: No  ? Sexual activity: Not on file  ?Other Topics Concern  ? Not on file  ?Social History Narrative  ? Not on file  ? ?Social Determinants of Health  ? ?Financial Resource Strain: Not on file  ?Food Insecurity: Not on file  ?Transportation Needs: Not on file  ?Physical Activity: Not on file  ?Stress: Not on file  ?Social Connections: Not on file  ?Intimate Partner Violence: Not on file  ? ? ?Family History  ?Problem Relation Age of Onset  ? Ulcers Mother   ? Hypertension Father   ? Prostate cancer Father   ? Hypertension Brother   ? Heart attack Maternal Grandfather   ? ? ?Review of Systems:  As stated in the HPI and otherwise negative.  ? ?BP 118/80   Pulse 65   Ht 6' (1.829 m)   Wt 176 lb 3.2 oz (79.9 kg)   SpO2 100%   BMI 23.90 kg/m?  ? ?Physical Examination: ? ?General: Well developed, well nourished, NAD  ?HEENT: OP clear, mucus membranes moist  ?SKIN: warm, dry. No rashes. ?Neuro: No focal deficits  ?Musculoskeletal: Muscle strength 5/5 all ext  ?Psychiatric: Mood and affect normal  ?Neck: No JVD, no carotid  bruits, no thyromegaly, no lymphadenopathy.  ?Lungs:Clear bilaterally, no wheezes, rhonci, crackles ?Cardiovascular: Regular rate and rhythm. No murmurs, gallops or rubs. ?Abdomen:Soft. Bowel sounds present. Non-tender.  ?Extremities: No lower extremity edema. Pulses are 2 + in the bilateral DP/PT. ? ?  EKG:  EKG is reviewed today.  ?The ekg ordered today demonstrates ? ?Recent Labs: ?No results found for requested labs within last 8760 hours.  ? ?Lipid Panel ?   ?Component Value Date/Time  ? CHOL 154 08/31/2020 1234  ? TRIG 69 08/31/2020 1234  ? HDL 60 08/31/2020 1234  ? CHOLHDL 2.6 08/31/2020 1234  ? CHOLHDL 3.6 01/23/2018 0825  ? VLDL 13 01/23/2018 0825  ? [place] 80 08/31/2020 1234  ? ?  ?Wt Readings from Last 3 Encounters:  ?01/20/22 176 lb 3.2 oz (79.9 kg)  ?09/28/20 179 lb 10.1 oz (81.5 kg)  ?08/31/20 181 lb (82.1 kg)  ?  ?Other studies Reviewed: ?Additional studies/ records that were reviewed today include: . ?Review of the above records demonstrates:  ? ?Assessment and Plan:  ? ?1. Aortic valve disease s/p AVR with mechanical St Jude valve: He has done well. He is on coumadin daily and this is followed by Dr. Terrence Dupont. He tells me that Dr. Terrence Dupont checks his INR monthly and writes for his coumadin. He uses antibiotic prophylaxis before dental visits. AVR working well by echo in 2020 ? ?2. Thoracic aortic aneurysm: Mild dilation by chest CTA in December 2021. Repeat CTA December 2023.  ? ?3. CAD without angina: Mild CAD on coronary CTA in 2020. Continue ASA and statin ? ?4. Hyperlipidemia: Will check lipids and LFTS today. Continues statin ? ?Current medicines are reviewed at length with the patient today.  The patient does not have concerns regarding medicines. ? ?The following changes have been made:  no change ? ?Labs/ tests ordered today include:  ? ?Orders Placed This Encounter  ?Procedures  ? CT ANGIO CHEST AORTA W/CM & OR WO/CM  ? Lipid panel  ? Hepatic function panel  ? EKG 12-Lead  ? ? ?Disposition:    F/U with me in 12  months ? ?Signed, ?Lauree Chandler, MD ?01/20/2022 9:15 AM    ?[facility] ?[address], [place], [place]  [address] ?Phone: [phone]; Fax: [phone]

## 2022-01-25 ENCOUNTER — Telehealth: Payer: Self-pay | Admitting: Cardiovascular Disease

## 2022-01-25 DIAGNOSIS — L258 Unspecified contact dermatitis due to other agents: Secondary | ICD-10-CM | POA: Diagnosis not present

## 2022-01-25 DIAGNOSIS — E7800 Pure hypercholesterolemia, unspecified: Secondary | ICD-10-CM

## 2022-01-25 NOTE — Telephone Encounter (Signed)
Patient returned call for lab results.  

## 2022-01-26 ENCOUNTER — Other Ambulatory Visit (HOSPITAL_COMMUNITY): Payer: Self-pay

## 2022-01-26 MED ORDER — CLOBETASOL PROPIONATE 0.05 % EX CREA
1.0000 "application " | TOPICAL_CREAM | Freq: Two times a day (BID) | CUTANEOUS | 3 refills | Status: AC
  Filled 2022-01-26: qty 60, 25d supply, fill #0

## 2022-01-30 NOTE — Telephone Encounter (Signed)
Left messages on home and mobile numbers to call back. ?

## 2022-02-07 ENCOUNTER — Other Ambulatory Visit (HOSPITAL_COMMUNITY): Payer: Self-pay

## 2022-02-10 DIAGNOSIS — L301 Dyshidrosis [pompholyx]: Secondary | ICD-10-CM | POA: Diagnosis not present

## 2022-02-10 DIAGNOSIS — L258 Unspecified contact dermatitis due to other agents: Secondary | ICD-10-CM | POA: Diagnosis not present

## 2022-02-22 NOTE — Telephone Encounter (Signed)
The patient reports he has not taken Crestor consistently and in fact recently missed close to 6 weeks because he had run out.  He is going to take Crestor 10 mg consistently and so will begin doing so and come for repeat labs in about 3 months.  I scheduled the appointment.

## 2022-02-22 NOTE — Telephone Encounter (Signed)
Follow Up:      Patient is returning a call, concerning his results.

## 2022-03-09 DIAGNOSIS — Z0000 Encounter for general adult medical examination without abnormal findings: Secondary | ICD-10-CM | POA: Diagnosis not present

## 2022-03-09 DIAGNOSIS — Z125 Encounter for screening for malignant neoplasm of prostate: Secondary | ICD-10-CM | POA: Diagnosis not present

## 2022-03-09 DIAGNOSIS — Z1329 Encounter for screening for other suspected endocrine disorder: Secondary | ICD-10-CM | POA: Diagnosis not present

## 2022-03-09 DIAGNOSIS — I2510 Atherosclerotic heart disease of native coronary artery without angina pectoris: Secondary | ICD-10-CM | POA: Diagnosis not present

## 2022-03-09 DIAGNOSIS — Z952 Presence of prosthetic heart valve: Secondary | ICD-10-CM | POA: Diagnosis not present

## 2022-05-02 ENCOUNTER — Other Ambulatory Visit (HOSPITAL_COMMUNITY): Payer: Self-pay

## 2022-05-24 ENCOUNTER — Ambulatory Visit: Payer: 59 | Attending: Cardiovascular Disease

## 2022-05-24 DIAGNOSIS — E7800 Pure hypercholesterolemia, unspecified: Secondary | ICD-10-CM | POA: Diagnosis not present

## 2022-05-24 LAB — LIPID PANEL
Chol/HDL Ratio: 2.2 ratio (ref 0.0–5.0)
Cholesterol, Total: 133 mg/dL (ref 100–199)
HDL: 60 mg/dL (ref >39)
LDL Chol Calc (NIH): 61 mg/dL (ref 0–99)
Triglycerides: 53 mg/dL (ref 0–149)
VLDL Cholesterol Cal: 12 mg/dL (ref 5–40)

## 2022-06-08 DIAGNOSIS — Z7901 Long term (current) use of anticoagulants: Secondary | ICD-10-CM | POA: Diagnosis not present

## 2022-06-26 ENCOUNTER — Other Ambulatory Visit (HOSPITAL_COMMUNITY): Payer: Self-pay

## 2022-06-26 DIAGNOSIS — U071 COVID-19: Secondary | ICD-10-CM | POA: Diagnosis not present

## 2022-06-26 MED ORDER — LAGEVRIO 200 MG PO CAPS
ORAL_CAPSULE | ORAL | 0 refills | Status: DC
  Filled 2022-06-26: qty 40, 5d supply, fill #0

## 2022-07-17 ENCOUNTER — Other Ambulatory Visit (HOSPITAL_COMMUNITY): Payer: Self-pay

## 2022-07-17 MED ORDER — AMOXICILLIN 500 MG PO CAPS
2000.0000 mg | ORAL_CAPSULE | ORAL | 0 refills | Status: DC
  Filled 2022-07-17: qty 12, 3d supply, fill #0

## 2022-07-18 ENCOUNTER — Other Ambulatory Visit (HOSPITAL_COMMUNITY): Payer: Self-pay

## 2022-07-19 ENCOUNTER — Other Ambulatory Visit (HOSPITAL_COMMUNITY): Payer: Self-pay

## 2022-07-19 MED ORDER — WARFARIN SODIUM 4 MG PO TABS
ORAL_TABLET | ORAL | 3 refills | Status: DC
  Filled 2022-07-19: qty 100, 87d supply, fill #0
  Filled 2022-11-02: qty 100, 87d supply, fill #1
  Filled 2023-02-08: qty 100, 87d supply, fill #2
  Filled 2023-05-22: qty 100, 87d supply, fill #3

## 2022-07-24 ENCOUNTER — Other Ambulatory Visit (HOSPITAL_COMMUNITY): Payer: Self-pay

## 2022-07-24 MED ORDER — HYDROCORTISONE 2.5 % EX CREA
1.0000 | TOPICAL_CREAM | Freq: Two times a day (BID) | CUTANEOUS | 1 refills | Status: AC
  Filled 2022-07-24: qty 30, 30d supply, fill #0

## 2022-08-01 DIAGNOSIS — Z7901 Long term (current) use of anticoagulants: Secondary | ICD-10-CM | POA: Diagnosis not present

## 2022-08-07 ENCOUNTER — Other Ambulatory Visit (HOSPITAL_COMMUNITY): Payer: Self-pay

## 2022-08-07 DIAGNOSIS — S3021XA Contusion of penis, initial encounter: Secondary | ICD-10-CM | POA: Diagnosis not present

## 2022-08-07 DIAGNOSIS — L309 Dermatitis, unspecified: Secondary | ICD-10-CM | POA: Diagnosis not present

## 2022-08-07 DIAGNOSIS — S39011A Strain of muscle, fascia and tendon of abdomen, initial encounter: Secondary | ICD-10-CM | POA: Diagnosis not present

## 2022-08-07 DIAGNOSIS — Z952 Presence of prosthetic heart valve: Secondary | ICD-10-CM | POA: Diagnosis not present

## 2022-08-07 MED ORDER — TRIAMCINOLONE ACETONIDE 0.1 % EX CREA
TOPICAL_CREAM | Freq: Two times a day (BID) | CUTANEOUS | 1 refills | Status: AC
  Filled 2022-08-07: qty 60, 30d supply, fill #0

## 2022-09-13 ENCOUNTER — Inpatient Hospital Stay: Admission: RE | Admit: 2022-09-13 | Payer: 59 | Source: Ambulatory Visit

## 2022-10-10 ENCOUNTER — Ambulatory Visit (HOSPITAL_BASED_OUTPATIENT_CLINIC_OR_DEPARTMENT_OTHER): Admission: RE | Admit: 2022-10-10 | Payer: Commercial Managed Care - PPO | Source: Ambulatory Visit

## 2022-10-17 ENCOUNTER — Telehealth: Payer: Self-pay | Admitting: Cardiovascular Disease

## 2022-10-17 DIAGNOSIS — I7121 Aneurysm of the ascending aorta, without rupture: Secondary | ICD-10-CM

## 2022-10-17 NOTE — Telephone Encounter (Signed)
Patient stated he would like orders for CT ANGIO CHEST AORTA W/CM & OR WO/CM sent to [facility] and would like a call back to confirm he can schedule the test.  Patient stated can leave VM.

## 2022-10-17 NOTE — Telephone Encounter (Signed)
Order is in [facility].  Reviewed w [facility] who called the patient and left a message for him to call back.

## 2022-11-01 ENCOUNTER — Ambulatory Visit
Admission: RE | Admit: 2022-11-01 | Discharge: 2022-11-01 | Disposition: A | Discharge status: Home / Self Care | Payer: Commercial Managed Care - PPO | Source: Ambulatory Visit | Attending: Cardiovascular Disease | Admitting: Cardiovascular Disease

## 2022-11-01 DIAGNOSIS — I7121 Aneurysm of the ascending aorta, without rupture: Secondary | ICD-10-CM

## 2022-11-01 DIAGNOSIS — I712 Thoracic aortic aneurysm, without rupture, unspecified: Secondary | ICD-10-CM | POA: Diagnosis not present

## 2022-11-01 MED ORDER — IOPAMIDOL (ISOVUE-370) INJECTION 76%
75.0000 mL | Freq: Once | INTRAVENOUS | Status: AC | PRN
  Administered 2022-11-01: 75 mL via INTRAVENOUS

## 2022-11-02 ENCOUNTER — Other Ambulatory Visit (HOSPITAL_COMMUNITY): Payer: Self-pay

## 2022-11-02 DIAGNOSIS — H43823 Vitreomacular adhesion, bilateral: Secondary | ICD-10-CM | POA: Diagnosis not present

## 2022-11-02 DIAGNOSIS — H34832 Tributary (branch) retinal vein occlusion, left eye, with macular edema: Secondary | ICD-10-CM | POA: Diagnosis not present

## 2022-11-02 DIAGNOSIS — H2513 Age-related nuclear cataract, bilateral: Secondary | ICD-10-CM | POA: Diagnosis not present

## 2022-11-16 DIAGNOSIS — H34832 Tributary (branch) retinal vein occlusion, left eye, with macular edema: Secondary | ICD-10-CM | POA: Diagnosis not present

## 2022-12-26 DIAGNOSIS — Z7901 Long term (current) use of anticoagulants: Secondary | ICD-10-CM | POA: Diagnosis not present

## 2023-01-15 ENCOUNTER — Ambulatory Visit: Payer: Commercial Managed Care - PPO | Attending: Cardiovascular Disease | Admitting: Cardiovascular Disease

## 2023-01-15 VITALS — BP 102/74 | HR 70 | Ht 72.0 in | Wt 185.4 lb

## 2023-01-15 DIAGNOSIS — I2510 Atherosclerotic heart disease of native coronary artery without angina pectoris: Secondary | ICD-10-CM | POA: Diagnosis not present

## 2023-01-15 DIAGNOSIS — Z952 Presence of prosthetic heart valve: Secondary | ICD-10-CM

## 2023-01-15 DIAGNOSIS — I7121 Aneurysm of the ascending aorta, without rupture: Secondary | ICD-10-CM | POA: Diagnosis not present

## 2023-01-15 DIAGNOSIS — Z01812 Encounter for preprocedural laboratory examination: Secondary | ICD-10-CM

## 2023-01-15 DIAGNOSIS — E7800 Pure hypercholesterolemia, unspecified: Secondary | ICD-10-CM

## 2023-01-15 NOTE — Progress Notes (Signed)
Chief Complaint  Patient presents with   Follow-up    CAD, aortic valve disease   History of Present Illness: 62 yo male with history of CAD, thoracic aortic aneurysm and aortic valve disease s/p AVR with St. Jude mechanical valve in 2006 on chronic coumadin therapy here today for follow up. His coumadin is followed by Dr. Sharyn Lull. He tells me that he does not discuss cardiac issues with Dr. Sharyn Lull but he manages his INR and writes for his coumadin.  Coronary CTA in May 2020 with mild disease in the LAD and Diagonal. Mild dilation of the ascending aorta. Echo May 2020 with LVEF=50-55%. AVR working well. Chest CTA February 2024 with 4.0 cm ascending aorta.   He is here today for follow up. The patient denies any chest pain, dyspnea, palpitations, lower extremity edema, orthopnea, PND, dizziness, near syncope or syncope.    Primary Care Physician: Eartha Inch, MD  Past Medical History:  Diagnosis Date   Anemia    Anxiety    Aortic insufficiency    EF 55%    , AVR 2006   CAD (coronary artery disease) 02/13/2019   Cor CTA 01/2019: LAD mid <25; D2 prox 25-49; Sinus of Valsalva dilated (40; 44 R+L non cor cusp); Ao root 43 mm   Dilated aortic root ([facility])    Slight dilatation of aortic root by echo, 2006  (CT scan at that time showed that the ascending aorta was less than 40 mm. CT scan at that time also showed no evidence coarct the descending aorta )   //   Aortic root  44 mm, echo, April, 2012 // Cor CTA 01/2019: LAD mid <25; D2 prox 25-49; Sinus of Valsalva dilated (40; 44 R+L non cor cusp); Ao root 43 mm // Chest CT 12/21: ascending aorta 39 mm    Drug therapy    Coumadin for mechanical aortic valve prosthesis   Ejection fraction    GI bleed    March, 2008, normal endoscopy and colonoscopy, question of small bowel ulcers by capsule endoscopy   Migraine    Mitral valve prolapse    S/P AVR (aortic valve replacement)     Dr. Tyrone Sage /      March, 2006, 27 mm St. Jude mechanical  valve // Echo 01/2019: EF 50-55, Gr 1 DD, dilated asc Aorta 41 mm, mechanical aortic valve normally functioning (peak/mean 20/9), mild AI    Past Surgical History:  Procedure Laterality Date   AORTIC VALVE REPLACEMENT  12/05/04    Current Outpatient Medications  Medication Sig Dispense Refill   aspirin 81 MG tablet Take 81 mg by mouth every other day.     nitroGLYCERIN (NITROSTAT) 0.4 MG SL tablet Place 1 tablet (0.4 mg total) under the tongue every 5 (five) minutes as needed for chest pain. 25 tablet 11   rosuvastatin (CRESTOR) 10 MG tablet Take 1 tablet (10 mg total) by mouth daily. 90 tablet 3   tiZANidine (ZANAFLEX) 4 MG tablet Take 1 tablet (4 mg total) by mouth every 8 (eight) hours as needed. 30 tablet 1   warfarin (COUMADIN) 4 MG tablet TAKE 1 TABLET BY MOUTH ONCE A DAY 5 DAYS/WEEK, THEN 1&1/2 TABLETS ON WEDNESDAY AND SUNDAY 100 tablet 3   amoxicillin (AMOXIL) 500 MG capsule Take 4 capsules (2,000 mg total) by mouth as directed 1 hour before dental appointment (Patient not taking: Reported on 01/15/2023) 12 capsule 0   cephALEXin (KEFLEX) 500 MG capsule Take 1 capsule (500 mg  total) by mouth 3 (three) times daily with meals. (Patient not taking: Reported on 01/15/2023) 30 capsule 0   cetirizine (ZYRTEC) 10 MG tablet Take 1 tablet (10 mg total) by mouth at bedtime. (Patient not taking: Reported on 01/15/2023) 30 tablet 0   clobetasol cream (TEMOVATE) 0.05 % Apply 1 application topically to affected areas 2 (two) times daily ( not to face groin and axilla) (Patient not taking: Reported on 01/15/2023) 60 g 3   doxycycline (ADOXA) 100 MG tablet Take 1 tablet twice a day (Patient not taking: Reported on 01/15/2023) 20 tablet 0   hydrocortisone 2.5 % cream Apply 1 application topically 2 (two) times daily to the affected area. (Patient not taking: Reported on 01/15/2023) 30 g 1   triamcinolone cream (KENALOG) 0.1 % Apply 1 application topically to affected area 4 (four) times daily. (Patient not  taking: Reported on 01/15/2023) 15 g 0   triamcinolone cream (KENALOG) 0.1 % Apply topically 2 (two) times daily. (Patient not taking: Reported on 01/15/2023) 60 g 1   No current facility-administered medications for this visit.    No Known Allergies  Social History   Socioeconomic History   Marital status: Married    Spouse name: Not on file   Number of children: Not on file   Years of education: Not on file   Highest education level: Not on file  Occupational History   Occupation: EKG tech    Employer: [employer]  Tobacco Use   Smoking status: Never   Smokeless tobacco: Never  Vaping Use   Vaping Use: Never used  Substance and Sexual Activity   Alcohol use: No   Drug use: No   Sexual activity: Not on file  Other Topics Concern   Not on file  Social History Narrative   Not on file   Social Determinants of Health   Financial Resource Strain: Not on file  Food Insecurity: Not on file  Transportation Needs: Not on file  Physical Activity: Not on file  Stress: Not on file  Social Connections: Not on file  Intimate Partner Violence: Not on file    Family History  Problem Relation Age of Onset   Ulcers Mother    Hypertension Father    Prostate cancer Father    Hypertension Brother    Heart attack Maternal Grandfather     Review of Systems:  As stated in the HPI and otherwise negative.   BP 102/74 (BP Location: Left Arm, Patient Position: Sitting, Cuff Size: Normal)   Pulse 70   Ht 6' (1.829 m)   Wt 84.1 kg   SpO2 97%   BMI 25.14 kg/m   Physical Examination:  General: Well developed, well nourished, NAD  HEENT: OP clear, mucus membranes moist  SKIN: warm, dry. No rashes. Neuro: No focal deficits  Musculoskeletal: Muscle strength 5/5 all ext  Psychiatric: Mood and affect normal  Neck: No JVD, no carotid bruits, no thyromegaly, no lymphadenopathy.  Lungs:Clear bilaterally, no wheezes, rhonci, crackles Cardiovascular: Regular rate and rhythm. No murmurs,  gallops or rubs. Abdomen:Soft. Bowel sounds present. Non-tender.  Extremities: No lower extremity edema. Pulses are 2 + in the bilateral DP/PT.  EKG:  EKG is reviewed today.  The ekg ordered today demonstrates NSR  Recent Labs: 01/20/2022: ALT 17   Lipid Panel    Component Value Date/Time   CHOL 133 05/24/2022 0759   TRIG 53 05/24/2022 0759   HDL 60 05/24/2022 0759   CHOLHDL 2.2 05/24/2022 0759   CHOLHDL  3.6 01/23/2018 0825   VLDL 13 01/23/2018 0825   LDLCALC 61 05/24/2022 0759     Wt Readings from Last 3 Encounters:  01/15/23 84.1 kg  01/20/22 79.9 kg  09/28/20 81.5 kg    Assessment and Plan:   1. Aortic valve disease s/p AVR with mechanical St Jude valve: He is on coumadin daily and this is followed by Dr. Sharyn Lull. He tells me that Dr. Sharyn Lull checks his INR monthly and writes for his coumadin. He uses antibiotic prophylaxis before dental visits. AVR working well by echo in 2020. No changes today  2. Thoracic aortic aneurysm: Mild dilation by chest CTA in February 2024 at 4.0 cm. Repeat CTA February 2025.   3. CAD without angina: Mild CAD on coronary CTA in 2020. No chest pain suggestive of angina. Continue ASA and statin.   4. Hyperlipidemia: LDL at goal in September 2023. Continue statin..   Labs/ tests ordered today include:   Orders Placed This Encounter  Procedures   CT ANGIO CHEST AORTA W/CM & OR WO/CM   Basic Metabolic Panel (BMET)   EKG 12-Lead   Disposition:   F/U with me in 12  months  Signed, Verne Carrow, MD 01/15/2023 3:36 PM    [facility] [address] [place], [place], [place]  [address] Phone: [phone]; Fax: [phone]

## 2023-01-15 NOTE — Patient Instructions (Addendum)
Medication Instructions:  No changes *If you need a refill on your cardiac medications before your next appointment, please call your pharmacy*   Lab Work: none   Testing/Procedures: Chest CTA Aorta - due Feb 2025.  Will need blood work prior.   Follow-Up: At [facility], you and your health needs are our priority.  As part of our continuing mission to provide you with exceptional heart care, we have created designated Provider Care Teams.  These Care Teams include your primary Cardiologist (physician) and Advanced Practice Providers (APPs -  Physician Assistants and Nurse Practitioners) who all work together to provide you with the care you need, when you need it.    Your next appointment:   12 month(s)  Provider:   Verne Carrow, MD

## 2023-01-18 DIAGNOSIS — H34832 Tributary (branch) retinal vein occlusion, left eye, with macular edema: Secondary | ICD-10-CM | POA: Diagnosis not present

## 2023-01-18 DIAGNOSIS — H43823 Vitreomacular adhesion, bilateral: Secondary | ICD-10-CM | POA: Diagnosis not present

## 2023-01-18 DIAGNOSIS — H2513 Age-related nuclear cataract, bilateral: Secondary | ICD-10-CM | POA: Diagnosis not present

## 2023-01-19 NOTE — Addendum Note (Signed)
Addended by: Lendon Ka on: 01/19/2023 04:24 PM   Modules accepted: Orders

## 2023-02-08 ENCOUNTER — Other Ambulatory Visit: Payer: Self-pay | Admitting: Cardiovascular Disease

## 2023-02-08 ENCOUNTER — Other Ambulatory Visit (HOSPITAL_COMMUNITY): Payer: Self-pay

## 2023-02-08 MED ORDER — ROSUVASTATIN CALCIUM 10 MG PO TABS
10.0000 mg | ORAL_TABLET | Freq: Every day | ORAL | 3 refills | Status: DC
  Filled 2023-02-08: qty 90, 90d supply, fill #0
  Filled 2023-05-22: qty 90, 90d supply, fill #1
  Filled 2023-08-21: qty 90, 90d supply, fill #2

## 2023-03-01 DIAGNOSIS — H34832 Tributary (branch) retinal vein occlusion, left eye, with macular edema: Secondary | ICD-10-CM | POA: Diagnosis not present

## 2023-03-01 DIAGNOSIS — H2513 Age-related nuclear cataract, bilateral: Secondary | ICD-10-CM | POA: Diagnosis not present

## 2023-03-01 DIAGNOSIS — H43823 Vitreomacular adhesion, bilateral: Secondary | ICD-10-CM | POA: Diagnosis not present

## 2023-04-20 DIAGNOSIS — Z7901 Long term (current) use of anticoagulants: Secondary | ICD-10-CM | POA: Diagnosis not present

## 2023-04-26 DIAGNOSIS — H34832 Tributary (branch) retinal vein occlusion, left eye, with macular edema: Secondary | ICD-10-CM | POA: Diagnosis not present

## 2023-04-26 DIAGNOSIS — H43823 Vitreomacular adhesion, bilateral: Secondary | ICD-10-CM | POA: Diagnosis not present

## 2023-04-26 DIAGNOSIS — H2513 Age-related nuclear cataract, bilateral: Secondary | ICD-10-CM | POA: Diagnosis not present

## 2023-05-23 ENCOUNTER — Other Ambulatory Visit: Payer: Self-pay

## 2023-05-23 ENCOUNTER — Other Ambulatory Visit (HOSPITAL_COMMUNITY): Payer: Self-pay

## 2023-05-25 ENCOUNTER — Other Ambulatory Visit (HOSPITAL_COMMUNITY): Payer: Self-pay

## 2023-05-25 MED ORDER — CLOBETASOL PROPIONATE 0.05 % EX CREA
TOPICAL_CREAM | Freq: Two times a day (BID) | CUTANEOUS | 3 refills | Status: AC | PRN
  Filled 2023-05-25: qty 60, 20d supply, fill #0

## 2023-07-12 DIAGNOSIS — H43823 Vitreomacular adhesion, bilateral: Secondary | ICD-10-CM | POA: Diagnosis not present

## 2023-07-12 DIAGNOSIS — H2513 Age-related nuclear cataract, bilateral: Secondary | ICD-10-CM | POA: Diagnosis not present

## 2023-07-12 DIAGNOSIS — H34832 Tributary (branch) retinal vein occlusion, left eye, with macular edema: Secondary | ICD-10-CM | POA: Diagnosis not present

## 2023-07-24 DIAGNOSIS — Z7901 Long term (current) use of anticoagulants: Secondary | ICD-10-CM | POA: Diagnosis not present

## 2023-08-21 ENCOUNTER — Other Ambulatory Visit (HOSPITAL_COMMUNITY): Payer: Self-pay

## 2023-08-22 ENCOUNTER — Other Ambulatory Visit (HOSPITAL_COMMUNITY): Payer: Self-pay

## 2023-08-22 MED ORDER — WARFARIN SODIUM 4 MG PO TABS
ORAL_TABLET | ORAL | 3 refills | Status: DC
  Filled 2023-08-22: qty 100, 87d supply, fill #0
  Filled 2023-11-21: qty 100, 87d supply, fill #1
  Filled 2024-02-17: qty 100, 87d supply, fill #2
  Filled 2024-05-20: qty 100, 87d supply, fill #3

## 2023-10-18 DIAGNOSIS — H43823 Vitreomacular adhesion, bilateral: Secondary | ICD-10-CM | POA: Diagnosis not present

## 2023-10-18 DIAGNOSIS — H34832 Tributary (branch) retinal vein occlusion, left eye, with macular edema: Secondary | ICD-10-CM | POA: Diagnosis not present

## 2023-10-18 DIAGNOSIS — H2513 Age-related nuclear cataract, bilateral: Secondary | ICD-10-CM | POA: Diagnosis not present

## 2023-11-29 DIAGNOSIS — H52222 Regular astigmatism, left eye: Secondary | ICD-10-CM | POA: Diagnosis not present

## 2024-01-30 DIAGNOSIS — Z7901 Long term (current) use of anticoagulants: Secondary | ICD-10-CM | POA: Diagnosis not present

## 2024-02-17 ENCOUNTER — Other Ambulatory Visit: Payer: Self-pay | Admitting: Cardiovascular Disease

## 2024-02-18 ENCOUNTER — Other Ambulatory Visit (HOSPITAL_COMMUNITY): Payer: Self-pay

## 2024-02-18 MED ORDER — ROSUVASTATIN CALCIUM 10 MG PO TABS
10.0000 mg | ORAL_TABLET | Freq: Every day | ORAL | 0 refills | Status: DC
  Filled 2024-02-18: qty 30, 30d supply, fill #0

## 2024-03-19 ENCOUNTER — Other Ambulatory Visit (HOSPITAL_COMMUNITY): Payer: Self-pay

## 2024-03-19 MED ORDER — AMOXICILLIN 500 MG PO CAPS
2000.0000 mg | ORAL_CAPSULE | Freq: Once | ORAL | 0 refills | Status: AC
  Filled 2024-03-19: qty 12, 3d supply, fill #0

## 2024-03-28 ENCOUNTER — Other Ambulatory Visit (HOSPITAL_COMMUNITY): Payer: Self-pay

## 2024-03-28 DIAGNOSIS — L258 Unspecified contact dermatitis due to other agents: Secondary | ICD-10-CM | POA: Diagnosis not present

## 2024-03-28 MED ORDER — HALOBETASOL PROPIONATE 0.05 % EX CREA
1.0000 | TOPICAL_CREAM | Freq: Two times a day (BID) | CUTANEOUS | 3 refills | Status: AC | PRN
  Filled 2024-03-28: qty 50, 25d supply, fill #0

## 2024-04-03 DIAGNOSIS — J300 Vasomotor rhinitis: Secondary | ICD-10-CM | POA: Diagnosis not present

## 2024-04-03 DIAGNOSIS — L299 Pruritus, unspecified: Secondary | ICD-10-CM | POA: Diagnosis not present

## 2024-04-03 DIAGNOSIS — R21 Rash and other nonspecific skin eruption: Secondary | ICD-10-CM | POA: Diagnosis not present

## 2024-04-14 ENCOUNTER — Other Ambulatory Visit (HOSPITAL_COMMUNITY): Payer: Self-pay

## 2024-04-14 ENCOUNTER — Other Ambulatory Visit: Payer: Self-pay | Admitting: Cardiovascular Disease

## 2024-04-14 ENCOUNTER — Other Ambulatory Visit: Payer: Self-pay

## 2024-04-14 MED ORDER — ROSUVASTATIN CALCIUM 10 MG PO TABS
10.0000 mg | ORAL_TABLET | Freq: Every day | ORAL | 0 refills | Status: DC
  Filled 2024-04-14: qty 15, 15d supply, fill #0

## 2024-05-20 ENCOUNTER — Other Ambulatory Visit (HOSPITAL_COMMUNITY): Payer: Self-pay

## 2024-05-20 ENCOUNTER — Other Ambulatory Visit: Payer: Self-pay | Admitting: Cardiovascular Disease

## 2024-05-20 ENCOUNTER — Other Ambulatory Visit: Payer: Self-pay

## 2024-05-21 ENCOUNTER — Other Ambulatory Visit (HOSPITAL_COMMUNITY): Payer: Self-pay

## 2024-05-21 MED ORDER — ROSUVASTATIN CALCIUM 10 MG PO TABS
10.0000 mg | ORAL_TABLET | Freq: Every day | ORAL | 0 refills | Status: DC
  Filled 2024-05-21: qty 15, 15d supply, fill #0

## 2024-06-05 DIAGNOSIS — Z7901 Long term (current) use of anticoagulants: Secondary | ICD-10-CM | POA: Diagnosis not present

## 2024-07-28 ENCOUNTER — Other Ambulatory Visit: Payer: Self-pay | Admitting: Cardiovascular Disease

## 2024-07-28 ENCOUNTER — Other Ambulatory Visit (HOSPITAL_COMMUNITY): Payer: Self-pay

## 2024-07-29 ENCOUNTER — Other Ambulatory Visit (HOSPITAL_COMMUNITY): Payer: Self-pay

## 2024-07-29 ENCOUNTER — Other Ambulatory Visit: Payer: Self-pay | Admitting: Cardiovascular Disease

## 2024-07-29 MED ORDER — WARFARIN SODIUM 4 MG PO TABS
ORAL_TABLET | ORAL | 0 refills | Status: DC
  Filled 2024-07-29: qty 100, 71d supply, fill #0
  Filled 2024-07-29: qty 100, 87d supply, fill #0

## 2024-07-30 ENCOUNTER — Other Ambulatory Visit (HOSPITAL_COMMUNITY): Payer: Self-pay

## 2024-07-30 ENCOUNTER — Other Ambulatory Visit: Payer: Self-pay

## 2024-08-01 ENCOUNTER — Other Ambulatory Visit (HOSPITAL_COMMUNITY): Payer: Self-pay

## 2024-08-01 ENCOUNTER — Other Ambulatory Visit: Payer: Self-pay

## 2024-08-06 ENCOUNTER — Other Ambulatory Visit: Payer: Self-pay

## 2024-08-06 ENCOUNTER — Other Ambulatory Visit (HOSPITAL_COMMUNITY): Payer: Self-pay

## 2024-08-07 ENCOUNTER — Other Ambulatory Visit (HOSPITAL_COMMUNITY): Payer: Self-pay

## 2024-08-08 ENCOUNTER — Other Ambulatory Visit (HOSPITAL_COMMUNITY): Payer: Self-pay

## 2024-08-08 MED ORDER — ROSUVASTATIN CALCIUM 10 MG PO TABS
10.0000 mg | ORAL_TABLET | Freq: Every day | ORAL | 0 refills | Status: DC
  Filled 2024-08-08 – 2024-09-02 (×2): qty 30, 30d supply, fill #0

## 2024-08-20 ENCOUNTER — Other Ambulatory Visit (HOSPITAL_COMMUNITY): Payer: Self-pay

## 2024-09-02 ENCOUNTER — Other Ambulatory Visit (HOSPITAL_COMMUNITY): Payer: Self-pay

## 2024-09-19 ENCOUNTER — Other Ambulatory Visit (HOSPITAL_COMMUNITY): Payer: Self-pay

## 2024-09-22 ENCOUNTER — Other Ambulatory Visit (HOSPITAL_COMMUNITY): Payer: Self-pay

## 2024-09-22 MED ORDER — WARFARIN SODIUM 4 MG PO TABS
ORAL_TABLET | ORAL | 2 refills | Status: AC
  Filled 2024-09-22: qty 100, 71d supply, fill #0

## 2024-09-22 MED ORDER — ROSUVASTATIN CALCIUM 10 MG PO TABS: 10.0000 mg | ORAL_TABLET | Freq: Every day | ORAL | 0 refills | Status: AC

## 2024-10-06 ENCOUNTER — Other Ambulatory Visit (HOSPITAL_COMMUNITY): Payer: Self-pay
